# Patient Record
Sex: Female | Born: 1965
Health system: Southern US, Community
[De-identification: ages and names within clinical notes are randomized; demographics above are authoritative.]

## PROBLEM LIST (undated history)

## (undated) DIAGNOSIS — J45909 Unspecified asthma, uncomplicated: Secondary | ICD-10-CM

## (undated) DIAGNOSIS — F419 Anxiety disorder, unspecified: Secondary | ICD-10-CM

## (undated) DIAGNOSIS — I517 Cardiomegaly: Secondary | ICD-10-CM

## (undated) DIAGNOSIS — H409 Unspecified glaucoma: Secondary | ICD-10-CM

## (undated) DIAGNOSIS — G4730 Sleep apnea, unspecified: Secondary | ICD-10-CM

## (undated) DIAGNOSIS — R51 Headache: Secondary | ICD-10-CM

## (undated) DIAGNOSIS — I10 Essential (primary) hypertension: Secondary | ICD-10-CM

## (undated) DIAGNOSIS — I421 Obstructive hypertrophic cardiomyopathy: Secondary | ICD-10-CM

## (undated) DIAGNOSIS — R0602 Shortness of breath: Secondary | ICD-10-CM

## (undated) DIAGNOSIS — D219 Benign neoplasm of connective and other soft tissue, unspecified: Secondary | ICD-10-CM

## (undated) HISTORY — DX: Shortness of breath: R06.02

## (undated) HISTORY — DX: Benign neoplasm of connective and other soft tissue, unspecified: D21.9

## (undated) HISTORY — DX: Cardiomegaly: I51.7

## (undated) HISTORY — DX: Obstructive hypertrophic cardiomyopathy: I42.1

---

## 2004-09-12 HISTORY — PX: BREAST SURGERY: SHX581

## 2004-09-12 HISTORY — PX: ABDOMINOPLASTY: SUR9

## 2006-03-13 ENCOUNTER — Other Ambulatory Visit: Admission: RE | Admit: 2006-03-13 | Discharge: 2006-03-13 | Payer: Self-pay | Admitting: Gynecology

## 2007-03-28 ENCOUNTER — Other Ambulatory Visit: Admission: RE | Admit: 2007-03-28 | Discharge: 2007-03-28 | Payer: Self-pay | Admitting: Gynecology

## 2008-03-28 ENCOUNTER — Other Ambulatory Visit: Admission: RE | Admit: 2008-03-28 | Discharge: 2008-03-28 | Payer: Self-pay | Admitting: Obstetrics and Gynecology

## 2009-03-09 ENCOUNTER — Emergency Department (HOSPITAL_COMMUNITY): Admission: EM | Admit: 2009-03-09 | Discharge: 2009-03-09 | Payer: Self-pay | Admitting: Family Medicine

## 2009-03-30 ENCOUNTER — Other Ambulatory Visit: Admission: RE | Admit: 2009-03-30 | Discharge: 2009-03-30 | Payer: Self-pay | Admitting: Obstetrics and Gynecology

## 2009-03-30 ENCOUNTER — Encounter: Payer: Self-pay | Admitting: Women's Health

## 2009-03-30 ENCOUNTER — Ambulatory Visit: Payer: Self-pay | Admitting: Women's Health

## 2009-04-01 ENCOUNTER — Ambulatory Visit: Payer: Self-pay | Admitting: Women's Health

## 2010-03-17 ENCOUNTER — Ambulatory Visit: Payer: Self-pay | Admitting: Women's Health

## 2010-04-05 ENCOUNTER — Ambulatory Visit: Payer: Self-pay | Admitting: Obstetrics and Gynecology

## 2010-04-19 ENCOUNTER — Ambulatory Visit: Payer: Self-pay | Admitting: Obstetrics and Gynecology

## 2010-11-08 ENCOUNTER — Other Ambulatory Visit: Payer: BC Managed Care – PPO

## 2010-11-08 ENCOUNTER — Ambulatory Visit (INDEPENDENT_AMBULATORY_CARE_PROVIDER_SITE_OTHER): Payer: BC Managed Care – PPO | Admitting: Women's Health

## 2010-11-08 DIAGNOSIS — N912 Amenorrhea, unspecified: Secondary | ICD-10-CM

## 2010-11-09 ENCOUNTER — Ambulatory Visit (INDEPENDENT_AMBULATORY_CARE_PROVIDER_SITE_OTHER): Payer: BC Managed Care – PPO | Admitting: Gynecology

## 2010-11-09 DIAGNOSIS — O039 Complete or unspecified spontaneous abortion without complication: Secondary | ICD-10-CM

## 2010-11-10 ENCOUNTER — Other Ambulatory Visit: Payer: BC Managed Care – PPO

## 2010-12-01 ENCOUNTER — Ambulatory Visit: Payer: BC Managed Care – PPO | Admitting: Women's Health

## 2010-12-01 ENCOUNTER — Other Ambulatory Visit: Payer: BC Managed Care – PPO

## 2010-12-10 ENCOUNTER — Other Ambulatory Visit (HOSPITAL_COMMUNITY)
Admission: RE | Admit: 2010-12-10 | Discharge: 2010-12-10 | Disposition: A | Discharge status: Home / Self Care | Payer: BC Managed Care – PPO | Source: Ambulatory Visit | Attending: Obstetrics and Gynecology | Admitting: Obstetrics and Gynecology

## 2010-12-10 ENCOUNTER — Encounter (INDEPENDENT_AMBULATORY_CARE_PROVIDER_SITE_OTHER): Payer: BC Managed Care – PPO | Admitting: Women's Health

## 2010-12-10 ENCOUNTER — Other Ambulatory Visit: Payer: Self-pay | Admitting: Obstetrics and Gynecology

## 2010-12-10 DIAGNOSIS — Z01419 Encounter for gynecological examination (general) (routine) without abnormal findings: Secondary | ICD-10-CM

## 2010-12-10 DIAGNOSIS — Z124 Encounter for screening for malignant neoplasm of cervix: Secondary | ICD-10-CM | POA: Insufficient documentation

## 2010-12-10 DIAGNOSIS — Z3049 Encounter for surveillance of other contraceptives: Secondary | ICD-10-CM

## 2010-12-10 DIAGNOSIS — Z13220 Encounter for screening for lipoid disorders: Secondary | ICD-10-CM

## 2010-12-13 ENCOUNTER — Encounter: Payer: BC Managed Care – PPO | Admitting: Obstetrics and Gynecology

## 2010-12-30 ENCOUNTER — Ambulatory Visit (INDEPENDENT_AMBULATORY_CARE_PROVIDER_SITE_OTHER): Payer: BC Managed Care – PPO | Admitting: Obstetrics and Gynecology

## 2010-12-30 DIAGNOSIS — Z30431 Encounter for routine checking of intrauterine contraceptive device: Secondary | ICD-10-CM

## 2011-02-10 ENCOUNTER — Ambulatory Visit (INDEPENDENT_AMBULATORY_CARE_PROVIDER_SITE_OTHER): Payer: BC Managed Care – PPO | Admitting: Obstetrics and Gynecology

## 2011-02-10 DIAGNOSIS — N949 Unspecified condition associated with female genital organs and menstrual cycle: Secondary | ICD-10-CM

## 2011-02-10 DIAGNOSIS — N938 Other specified abnormal uterine and vaginal bleeding: Secondary | ICD-10-CM

## 2011-10-17 ENCOUNTER — Other Ambulatory Visit: Payer: Self-pay | Admitting: *Deleted

## 2011-10-17 MED ORDER — ALPRAZOLAM 0.25 MG PO TABS: 0.2500 mg | ORAL_TABLET | Freq: Every evening | ORAL | Status: DC | PRN

## 2011-10-17 NOTE — Telephone Encounter (Signed)
rx called in

## 2012-05-25 ENCOUNTER — Encounter: Payer: Self-pay | Admitting: Women's Health

## 2012-05-25 ENCOUNTER — Ambulatory Visit (INDEPENDENT_AMBULATORY_CARE_PROVIDER_SITE_OTHER): Payer: BC Managed Care – PPO | Admitting: Women's Health

## 2012-05-25 VITALS — BP 136/86 | Ht 65.0 in | Wt 202.0 lb

## 2012-05-25 DIAGNOSIS — Z01419 Encounter for gynecological examination (general) (routine) without abnormal findings: Secondary | ICD-10-CM

## 2012-05-25 DIAGNOSIS — N926 Irregular menstruation, unspecified: Secondary | ICD-10-CM

## 2012-05-25 DIAGNOSIS — N925 Other specified irregular menstruation: Secondary | ICD-10-CM

## 2012-05-25 DIAGNOSIS — N949 Unspecified condition associated with female genital organs and menstrual cycle: Secondary | ICD-10-CM

## 2012-05-25 DIAGNOSIS — N938 Other specified abnormal uterine and vaginal bleeding: Secondary | ICD-10-CM

## 2012-05-25 DIAGNOSIS — D259 Leiomyoma of uterus, unspecified: Secondary | ICD-10-CM

## 2012-05-25 LAB — TSH: TSH: 0.615 u[IU]/mL (ref 0.350–4.500)

## 2012-05-25 NOTE — Progress Notes (Signed)
Monica Marks [birth year] [id number]    History:    The patient presents for annual exam.  Mirena IUD placed 12/2010. First year light irregular cycle. Since June 2013 irregular bleeding with occasional heavy flow most days. History of normal Paps. Has not had a mammogram. History of fibroids   Past medical history, past surgical history, family history and social history were all reviewed and documented in the EPIC chart. Father with history of melanoma, both parents with hypertension, mother with lung cancer. Has had a breast lift with tummy tuck 2005. 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  3 children Katie 16, [name] 14, Bo 11, all doing well.   ROS:  A  ROS was performed and pertinent positives and negatives are included in the history.  Exam:  Filed Vitals:   05/25/12 1129  BP: 136/86    General appearance:  Normal Head/Neck:  Normal, without cervical or supraclavicular adenopathy. Thyroid:  Symmetrical, normal in size, without palpable masses or nodularity. Respiratory  Effort:  Normal  Auscultation:  Clear without wheezing or rhonchi Cardiovascular  Auscultation:  Regular rate, without rubs, murmurs or gallops  Edema/varicosities:  Not grossly evident Abdominal  Soft,nontender, without masses, guarding or rebound/tummy tuck scar.  Liver/spleen:  No organomegaly noted  Hernia:  None appreciated  Skin  Inspection:  Grossly normal  Palpation:  Grossly normal Neurologic/psychiatric  Orientation:  Normal with appropriate conversation.  Mood/affect:  Normal  Genitourinary    Breasts: Examined lying and sitting/ reduction scarring.     Right: Without masses, retractions, discharge or axillary adenopathy.     Left: Without masses, retractions, discharge or axillary adenopathy.   Inguinal/mons:  Normal without inguinal adenopathy  External genitalia:  Normal  BUS/Urethra/Skene's glands:  Normal  Bladder:  Normal  Vagina:  Normal  Cervix:  Normal IUD  string not visible  Uterus:   normal in size, shape and contour.  Midline and mobile  Adnexa/parametria:     Rt: Without masses or tenderness.   Lt: Without masses or tenderness.  Anus and perineum: Normal  Digital rectal exam: Normal sphincter tone without palpated masses or tenderness  Assessment/Plan:  46 y.o. M. WF G5 P3 for annual exam irregular bleeding with menorrhagia for 3 months.  Mirena IUD 12/2010 New onset menorrhagia and DUB for 3 months Fibroids  Plan: Labs at primary care, TSH, UA, UPT. No Pap history of normal Paps, new screening guidelines reviewed. Schedule sonohysterogram with Dr. Eda Paschal to check IUD placement and DUB. SBE's, reviewed importance of annual mammogram and instructed to schedule. Increase regular exercise and decrease calories for weight loss, calcium rich diet, vitamin D 1000 daily encouraged. Reviewed blood pressure slightly elevated today encouraged to monitor followup with primary care as needed. Continue annual skin checks.    Harrington Challenger [facility], 12:37 PM 05/25/2012

## 2012-05-25 NOTE — Patient Instructions (Addendum)

## 2012-05-26 LAB — URINALYSIS W MICROSCOPIC + REFLEX CULTURE
Bilirubin Urine: NEGATIVE
Glucose, UA: NEGATIVE mg/dL
Hgb urine dipstick: NEGATIVE
Protein, ur: NEGATIVE mg/dL
Squamous Epithelial / LPF: NONE SEEN

## 2012-06-06 ENCOUNTER — Other Ambulatory Visit: Payer: Self-pay | Admitting: Obstetrics and Gynecology

## 2012-06-06 ENCOUNTER — Ambulatory Visit (INDEPENDENT_AMBULATORY_CARE_PROVIDER_SITE_OTHER): Payer: BC Managed Care – PPO | Admitting: Obstetrics and Gynecology

## 2012-06-06 ENCOUNTER — Ambulatory Visit (INDEPENDENT_AMBULATORY_CARE_PROVIDER_SITE_OTHER): Payer: BC Managed Care – PPO

## 2012-06-06 DIAGNOSIS — N926 Irregular menstruation, unspecified: Secondary | ICD-10-CM

## 2012-06-06 DIAGNOSIS — D259 Leiomyoma of uterus, unspecified: Secondary | ICD-10-CM

## 2012-06-06 DIAGNOSIS — D219 Benign neoplasm of connective and other soft tissue, unspecified: Secondary | ICD-10-CM

## 2012-06-06 DIAGNOSIS — N949 Unspecified condition associated with female genital organs and menstrual cycle: Secondary | ICD-10-CM

## 2012-06-06 DIAGNOSIS — N938 Other specified abnormal uterine and vaginal bleeding: Secondary | ICD-10-CM

## 2012-06-06 NOTE — Patient Instructions (Addendum)
Schedule ultrasound and office visit at the end of October.

## 2012-06-06 NOTE — Progress Notes (Signed)
Patient came to see me today for a saline infusion histogram because of persistent menometrorrhagia with heavy bleeding many days since June, 2013. She had a Mirena IUD placed in 2012 and did well with that until this June. She has had 3 children by cesarean section and uses the IUD both for contraception and cycle control. We went ahead and did an ultrasound and the patient has multiple fibroids. There are at least 6 in the range in size with the largest being 3.3 cm. Her endometrial echo is 5 mm and there is a clear  submucous myoma seen on ultrasound of 3.5 cm. Her right ovary has an echo-free thin-walled cyst of 3.6 cm. It is avascular. Her left ovary is normal. Her cul-de-sac is free of fluid. A catheter was placed in her uterus and saline was introduced. The submucous myoma is approximately 70% in the cavity and 30% in the myometrial wall. An endometrial biopsy was done.  I discussed the options with the patient. It is possible with hysteroscopy we might be able to  remove the entire submucous myoma but we might not be able to. She obviously has the other fibroids as well. This patient strongly favors vaginal hysterectomy. She was given the ACOG  booklet on hysterectomy.she would like to do this surgery in November. We will do an ultrasound first with hopes that the cyst has resolved. The patient does not want to get pregnant again.

## 2012-07-09 ENCOUNTER — Other Ambulatory Visit: Payer: Self-pay | Admitting: Obstetrics and Gynecology

## 2012-07-09 ENCOUNTER — Ambulatory Visit (INDEPENDENT_AMBULATORY_CARE_PROVIDER_SITE_OTHER): Payer: BC Managed Care – PPO | Admitting: Obstetrics and Gynecology

## 2012-07-09 ENCOUNTER — Ambulatory Visit (INDEPENDENT_AMBULATORY_CARE_PROVIDER_SITE_OTHER): Payer: BC Managed Care – PPO

## 2012-07-09 DIAGNOSIS — D259 Leiomyoma of uterus, unspecified: Secondary | ICD-10-CM

## 2012-07-09 DIAGNOSIS — N838 Other noninflammatory disorders of ovary, fallopian tube and broad ligament: Secondary | ICD-10-CM

## 2012-07-09 DIAGNOSIS — N831 Corpus luteum cyst of ovary, unspecified side: Secondary | ICD-10-CM

## 2012-07-09 DIAGNOSIS — N83209 Unspecified ovarian cyst, unspecified side: Secondary | ICD-10-CM

## 2012-07-09 DIAGNOSIS — D219 Benign neoplasm of connective and other soft tissue, unspecified: Secondary | ICD-10-CM

## 2012-07-09 DIAGNOSIS — N839 Noninflammatory disorder of ovary, fallopian tube and broad ligament, unspecified: Secondary | ICD-10-CM

## 2012-07-09 DIAGNOSIS — N938 Other specified abnormal uterine and vaginal bleeding: Secondary | ICD-10-CM

## 2012-07-09 DIAGNOSIS — N949 Unspecified condition associated with female genital organs and menstrual cycle: Secondary | ICD-10-CM

## 2012-07-09 NOTE — Progress Notes (Signed)
The patient came back to discuss her upcoming surgery. Before we talked we did a followup ultrasound because of an ovarian cyst on her right ovary present in September. On ultrasound today multiple fibroids and once again seen. Her IUD was in place. The previous cyst on her right ovary has not changed in size or appearance. It remains thin-walled, avascular and echo-free. It is 3.3 cm. She now however has a second cyst of 2-1/2 cm with vascular flow to the mass. Her left ovary remains normal. Her cul-de-sac is free of fluid.  We discussed her impending surgery for menorrhagia with large submucous myoma. Our original plan was to do it vaginally. She appreciates she will not be Able to  get pregnant. We will leave her IUD in until the day of surgery. Due to the persistent right ovarian cyst plus a second ovarian cyst we will now do a right salpingo-oophorectomy. We decided to do this rather than ovarian cystectomies due to her age and to prevent adhesive disease. We will therefore start the surgery laparoscopically and do a right salpingo-oophorectomy, LAVH. We discussed normal recuperation and convalescence. She will stop nonsteroidal anti-inflammatory drugs. She had an abdominoplasty in [place]. We will get the operative note and the plastic surgeons recommendation regarding absorbable versus nonAbsorbable suture. The patient is aware of the risk of laparotomy. We would use her old cesarean section scars. She is aware of the risk of bladder injury due to 3 cesarean sections. We discussed other risks including infection, hemorrhage, need for blood, breakdown of incision, injury to bowel bladder or ureter and fistula formation. All her questions were answered in  a 30 minute interview.

## 2012-07-09 NOTE — Patient Instructions (Signed)
Get Me  old operative note.

## 2012-07-17 ENCOUNTER — Encounter (HOSPITAL_COMMUNITY): Payer: Self-pay | Admitting: Pharmacist

## 2012-07-23 ENCOUNTER — Other Ambulatory Visit: Payer: Self-pay

## 2012-07-23 ENCOUNTER — Encounter (HOSPITAL_COMMUNITY)
Admission: RE | Admit: 2012-07-23 | Discharge: 2012-07-23 | Disposition: A | Discharge status: Home / Self Care | Payer: BC Managed Care – PPO | Source: Ambulatory Visit | Attending: Obstetrics and Gynecology | Admitting: Obstetrics and Gynecology

## 2012-07-23 ENCOUNTER — Encounter (HOSPITAL_COMMUNITY): Payer: Self-pay

## 2012-07-23 HISTORY — DX: Essential (primary) hypertension: I10

## 2012-07-23 HISTORY — DX: Unspecified glaucoma: H40.9

## 2012-07-23 HISTORY — DX: Unspecified asthma, uncomplicated: J45.909

## 2012-07-23 HISTORY — DX: Headache: R51

## 2012-07-23 HISTORY — DX: Anxiety disorder, unspecified: F41.9

## 2012-07-23 LAB — CBC
HCT: 42.9 % (ref 36.0–46.0)
MCHC: 35.7 g/dL (ref 30.0–36.0)
Platelets: 226 10*3/uL (ref 150–400)
RDW: 12.2 % (ref 11.5–15.5)
WBC: 8.7 10*3/uL (ref 4.0–10.5)

## 2012-07-23 LAB — BASIC METABOLIC PANEL
Chloride: 99 mEq/L (ref 96–112)
GFR calc Af Amer: >90 mL/min (ref >90)
GFR calc non Af Amer: >90 mL/min (ref >90)
Potassium: 4.1 mEq/L (ref 3.5–5.1)
Sodium: 137 mEq/L (ref 135–145)

## 2012-07-23 LAB — SURGICAL PCR SCREEN
MRSA, PCR: NEGATIVE
Staphylococcus aureus: NEGATIVE

## 2012-07-23 NOTE — Patient Instructions (Addendum)
   Your procedure is scheduled on: Monday November 18th  Enter through the Main Entrance of [facility] at:6am Pick up the phone at the desk and dial [phone] and inform us of your arrival.  Please call this number if you have any problems the morning of surgery: [phone]  Remember: Do not eat or drink anything after midnight on Sunday Please take your blood pressure medicine, lexapro with sips of water morning of surgery. Bring inhalers with you  Do not wear jewelry, make-up, or FINGER nail polish No metal in your hair or on your body. Do not wear lotions, powders, perfumes. You may wear deodorant.  Please use your CHG wash as directed prior to surgery.  Do not shave anywhere for at least 12 hours prior to first CHG shower.  Do not bring valuables to the hospital. Contacts  may not be worn into surgery.  Leave suitcase in the car. After Surgery it may be brought to your room. For patients being admitted to the hospital, checkout time is 11:00am the day of discharge.

## 2012-07-23 NOTE — Pre-Procedure Instructions (Addendum)
Reviewed EKG/Meds/Health History with Dr Neta Ehlers for Surgery. Pt mother deceased from Lung Cancer, Father living/siblings living. Pt able to climb flight of stairs without sob. Does have astha-will bring inhalers dos.

## 2012-07-26 NOTE — H&P (Signed)
  Chief complaint: Fibroids with metromenorrhagia, right ovarian cysts. History of present illness: Patient is a 46 year old gravida 5 para 3 AB 2 who was doing well with a Mirena IUD with amenorrhea until June, 2013. She then started to have severe metromenorrhagia. She was scheduled for a saline infusion histogram which showed multiple fibroids including a large submucous fibroid that both filled the cavity and extended into the myometrium. After discussing options with the patient she has elected to have a hysterectomy. When the saline infusion histogram was done she had an echofree  right ovarian cyst. On followup ultrasound the cyst persisted and now had increased vascular flow to it in addition she had a complex second ovarian cyst. As a result of this she will also undergo right salpingo-oophorectomy. She will therefore have a laparoscopic vaginal hysterectomy, right salpingo-oophorectomy. She understands all the risks of surgery. She understands other options other than hysterectomy.  Past medical history, family history, social history, and review of systems in epic record and reviewed.  Physical examination: HEENT within normal limits. Neck: Thyroid not large. No masses. Supraclavicular nodes: not enlarged. Breasts: Examined in both sitting and lying  position. No skin changes and no masses. Abdomen: Soft no guarding rebound or masses or hernia. Pelvic: External: Within normal limits. BUS: Within normal limits. Vaginal:within normal limits. Good estrogen effect. No evidence of cystocele rectocele or enterocele. Cervix: clean. Uterus: 10 week fibroid uterus. Adnexa: No masses. Rectovaginal exam: Confirmatory and negative. Extremities: Within normal limits.  Assessment: #1. Fibroids with a large submucous myoma. #2. Persistent metromenorrhagia. #3. Persistent ovarian cysts.  Plan: Laparoscopic-assisted vaginal hysterectomy, right salpingo-oophorectomy.

## 2012-07-30 ENCOUNTER — Encounter (HOSPITAL_COMMUNITY): Payer: Self-pay | Admitting: Anesthesiology

## 2012-07-30 ENCOUNTER — Inpatient Hospital Stay (HOSPITAL_COMMUNITY)
Admission: RE | Admit: 2012-07-30 | Discharge: 2012-08-01 | DRG: 359 | Disposition: A | Discharge status: Home / Self Care | Payer: BC Managed Care – PPO | Source: Ambulatory Visit | Attending: Obstetrics and Gynecology | Admitting: Obstetrics and Gynecology

## 2012-07-30 ENCOUNTER — Encounter (HOSPITAL_COMMUNITY): Payer: Self-pay | Admitting: *Deleted

## 2012-07-30 ENCOUNTER — Encounter (HOSPITAL_COMMUNITY)
Admission: RE | Disposition: A | Discharge status: Home / Self Care | Payer: Self-pay | Source: Ambulatory Visit | Attending: Obstetrics and Gynecology

## 2012-07-30 ENCOUNTER — Ambulatory Visit (HOSPITAL_COMMUNITY): Payer: BC Managed Care – PPO | Admitting: Anesthesiology

## 2012-07-30 DIAGNOSIS — D259 Leiomyoma of uterus, unspecified: Secondary | ICD-10-CM

## 2012-07-30 DIAGNOSIS — N920 Excessive and frequent menstruation with regular cycle: Secondary | ICD-10-CM

## 2012-07-30 DIAGNOSIS — D250 Submucous leiomyoma of uterus: Secondary | ICD-10-CM | POA: Diagnosis present

## 2012-07-30 DIAGNOSIS — N83209 Unspecified ovarian cyst, unspecified side: Secondary | ICD-10-CM

## 2012-07-30 DIAGNOSIS — D251 Intramural leiomyoma of uterus: Principal | ICD-10-CM | POA: Diagnosis present

## 2012-07-30 DIAGNOSIS — Z5331 Laparoscopic surgical procedure converted to open procedure: Secondary | ICD-10-CM

## 2012-07-30 HISTORY — PX: LAPAROSCOPY: SHX197

## 2012-07-30 HISTORY — PX: SALPINGOOPHORECTOMY: SHX82

## 2012-07-30 HISTORY — PX: ABDOMINAL HYSTERECTOMY: SHX81

## 2012-07-30 LAB — HCG, SERUM, QUALITATIVE: Preg, Serum: NEGATIVE

## 2012-07-30 SURGERY — SALPINGO-OOPHORECTOMY, OPEN
Anesthesia: General | Site: Abdomen | Laterality: Right | Wound class: Clean Contaminated

## 2012-07-30 MED ORDER — PHENYLEPHRINE 40 MCG/ML (10ML) SYRINGE FOR IV PUSH (FOR BLOOD PRESSURE SUPPORT)
PREFILLED_SYRINGE | INTRAVENOUS | Status: AC
  Filled 2012-07-30: qty 5

## 2012-07-30 MED ORDER — GLYCOPYRROLATE 0.2 MG/ML IJ SOLN
INTRAMUSCULAR | Status: AC
  Filled 2012-07-30: qty 4

## 2012-07-30 MED ORDER — CEFAZOLIN SODIUM-DEXTROSE 2-3 GM-% IV SOLR
2.0000 g | INTRAVENOUS | Status: AC
  Administered 2012-07-30: 2 g via INTRAVENOUS

## 2012-07-30 MED ORDER — HYDROMORPHONE HCL PF 1 MG/ML IJ SOLN
INTRAMUSCULAR | Status: AC
  Administered 2012-07-30: 0.5 mg via INTRAVENOUS
  Filled 2012-07-30: qty 1

## 2012-07-30 MED ORDER — FENTANYL CITRATE 0.05 MG/ML IJ SOLN
INTRAMUSCULAR | Status: AC
  Filled 2012-07-30: qty 10

## 2012-07-30 MED ORDER — DEXAMETHASONE SODIUM PHOSPHATE 10 MG/ML IJ SOLN
INTRAMUSCULAR | Status: AC
  Filled 2012-07-30: qty 1

## 2012-07-30 MED ORDER — LACTATED RINGERS IR SOLN
Status: DC | PRN
  Administered 2012-07-30: 3000 mL

## 2012-07-30 MED ORDER — HYDROCODONE-ACETAMINOPHEN 5-325 MG PO TABS
1.0000 | ORAL_TABLET | ORAL | Status: DC | PRN
  Administered 2012-07-30 – 2012-08-01 (×8): 2 via ORAL
  Filled 2012-07-30 (×8): qty 2

## 2012-07-30 MED ORDER — KETOROLAC TROMETHAMINE 30 MG/ML IJ SOLN
INTRAMUSCULAR | Status: AC
  Filled 2012-07-30: qty 1

## 2012-07-30 MED ORDER — MEPERIDINE HCL 25 MG/ML IJ SOLN: 6.2500 mg | INTRAMUSCULAR | Status: DC | PRN

## 2012-07-30 MED ORDER — FENTANYL CITRATE 0.05 MG/ML IJ SOLN
INTRAMUSCULAR | Status: DC | PRN
  Administered 2012-07-30 (×2): 50 ug via INTRAVENOUS
  Administered 2012-07-30: 100 ug via INTRAVENOUS
  Administered 2012-07-30 (×2): 50 ug via INTRAVENOUS

## 2012-07-30 MED ORDER — BUPIVACAINE LIPOSOME 1.3 % IJ SUSP
20.0000 mL | Freq: Once | INTRAMUSCULAR | Status: AC
  Administered 2012-07-30: 20 mL
  Filled 2012-07-30: qty 20

## 2012-07-30 MED ORDER — PROPOFOL 10 MG/ML IV EMUL
INTRAVENOUS | Status: DC | PRN
  Administered 2012-07-30: 200 mg via INTRAVENOUS

## 2012-07-30 MED ORDER — METOCLOPRAMIDE HCL 5 MG/ML IJ SOLN: 10.0000 mg | Freq: Once | INTRAMUSCULAR | Status: DC | PRN

## 2012-07-30 MED ORDER — DIPHENHYDRAMINE HCL 50 MG/ML IJ SOLN
50.0000 mg | Freq: Once | INTRAMUSCULAR | Status: AC
Start: 2012-07-30 — End: 2012-07-30
  Administered 2012-07-30: 50 mg via INTRAVENOUS
  Filled 2012-07-30: qty 1

## 2012-07-30 MED ORDER — CEFAZOLIN SODIUM-DEXTROSE 2-3 GM-% IV SOLR
INTRAVENOUS | Status: AC
  Filled 2012-07-30: qty 50

## 2012-07-30 MED ORDER — ONDANSETRON HCL 4 MG/2ML IJ SOLN
INTRAMUSCULAR | Status: AC
  Filled 2012-07-30: qty 2

## 2012-07-30 MED ORDER — NEOSTIGMINE METHYLSULFATE 1 MG/ML IJ SOLN
INTRAMUSCULAR | Status: AC
  Filled 2012-07-30: qty 10

## 2012-07-30 MED ORDER — NEOSTIGMINE METHYLSULFATE 1 MG/ML IJ SOLN
INTRAMUSCULAR | Status: DC | PRN
  Administered 2012-07-30: 3 mg via INTRAVENOUS

## 2012-07-30 MED ORDER — ROCURONIUM BROMIDE 50 MG/5ML IV SOLN
INTRAVENOUS | Status: AC
  Filled 2012-07-30: qty 1

## 2012-07-30 MED ORDER — MIDAZOLAM HCL 2 MG/2ML IJ SOLN
INTRAMUSCULAR | Status: AC
  Filled 2012-07-30: qty 2

## 2012-07-30 MED ORDER — ONDANSETRON HCL 4 MG/2ML IJ SOLN
INTRAMUSCULAR | Status: DC | PRN
  Administered 2012-07-30: 4 mg via INTRAVENOUS

## 2012-07-30 MED ORDER — KETOROLAC TROMETHAMINE 30 MG/ML IJ SOLN: 30.0000 mg | Freq: Once | INTRAMUSCULAR | Status: DC

## 2012-07-30 MED ORDER — MENTHOL 3 MG MT LOZG
1.0000 | LOZENGE | OROMUCOSAL | Status: DC | PRN
  Filled 2012-07-30: qty 9

## 2012-07-30 MED ORDER — LACTATED RINGERS IV SOLN
INTRAVENOUS | Status: DC
  Administered 2012-07-30 (×3): via INTRAVENOUS

## 2012-07-30 MED ORDER — ONDANSETRON HCL 4 MG PO TABS: 4.0000 mg | ORAL_TABLET | Freq: Four times a day (QID) | ORAL | Status: DC | PRN

## 2012-07-30 MED ORDER — LIDOCAINE HCL (CARDIAC) 20 MG/ML IV SOLN
INTRAVENOUS | Status: AC
  Filled 2012-07-30: qty 5

## 2012-07-30 MED ORDER — HYDROMORPHONE HCL PF 1 MG/ML IJ SOLN
0.2500 mg | INTRAMUSCULAR | Status: DC | PRN
  Administered 2012-07-30 (×3): 0.5 mg via INTRAVENOUS

## 2012-07-30 MED ORDER — LIDOCAINE HCL (CARDIAC) 20 MG/ML IV SOLN
INTRAVENOUS | Status: DC | PRN
  Administered 2012-07-30: 50 mg via INTRAVENOUS

## 2012-07-30 MED ORDER — MIDAZOLAM HCL 5 MG/5ML IJ SOLN
INTRAMUSCULAR | Status: DC | PRN
  Administered 2012-07-30: 2 mg via INTRAVENOUS

## 2012-07-30 MED ORDER — ROCURONIUM BROMIDE 100 MG/10ML IV SOLN
INTRAVENOUS | Status: DC | PRN
  Administered 2012-07-30 (×2): 10 mg via INTRAVENOUS
  Administered 2012-07-30: 20 mg via INTRAVENOUS
  Administered 2012-07-30: 40 mg via INTRAVENOUS

## 2012-07-30 MED ORDER — DEXAMETHASONE SODIUM PHOSPHATE 4 MG/ML IJ SOLN
INTRAMUSCULAR | Status: DC | PRN
  Administered 2012-07-30: 8 mg via INTRAVENOUS

## 2012-07-30 MED ORDER — PHENYLEPHRINE HCL 10 MG/ML IJ SOLN
INTRAMUSCULAR | Status: DC | PRN
  Administered 2012-07-30 (×9): 40 ug via INTRAVENOUS

## 2012-07-30 MED ORDER — SCOPOLAMINE 1 MG/3DAYS TD PT72
MEDICATED_PATCH | TRANSDERMAL | Status: AC
  Administered 2012-07-30: 1.5 mg via TRANSDERMAL
  Filled 2012-07-30: qty 1

## 2012-07-30 MED ORDER — LIDOCAINE-EPINEPHRINE 0.5 %-1:200000 IJ SOLN
INTRAMUSCULAR | Status: DC | PRN
  Administered 2012-07-30: 30 mL

## 2012-07-30 MED ORDER — TEMAZEPAM 15 MG PO CAPS: 15.0000 mg | ORAL_CAPSULE | Freq: Every evening | ORAL | Status: DC | PRN

## 2012-07-30 MED ORDER — ONDANSETRON HCL 4 MG/2ML IJ SOLN: 4.0000 mg | Freq: Four times a day (QID) | INTRAMUSCULAR | Status: DC | PRN

## 2012-07-30 MED ORDER — PROPOFOL 10 MG/ML IV EMUL
INTRAVENOUS | Status: AC
  Filled 2012-07-30: qty 20

## 2012-07-30 MED ORDER — GLYCOPYRROLATE 0.2 MG/ML IJ SOLN
INTRAMUSCULAR | Status: DC | PRN
  Administered 2012-07-30: 0.2 mg via INTRAVENOUS

## 2012-07-30 MED ORDER — ACETAMINOPHEN 10 MG/ML IV SOLN
1000.0000 mg | Freq: Once | INTRAVENOUS | Status: AC
  Administered 2012-07-30: 1000 mg via INTRAVENOUS
  Filled 2012-07-30: qty 100

## 2012-07-30 MED ORDER — DIPHENHYDRAMINE HCL 50 MG/ML IJ SOLN
50.0000 mg | Freq: Four times a day (QID) | INTRAMUSCULAR | Status: DC | PRN
  Administered 2012-07-30: 50 mg via INTRAVENOUS
  Filled 2012-07-30: qty 1

## 2012-07-30 MED ORDER — KETOROLAC TROMETHAMINE 30 MG/ML IJ SOLN
INTRAMUSCULAR | Status: DC | PRN
  Administered 2012-07-30: 30 mg via INTRAVENOUS

## 2012-07-30 MED ORDER — OXYCODONE-ACETAMINOPHEN 5-325 MG PO TABS: 1.0000 | ORAL_TABLET | ORAL | Status: DC | PRN

## 2012-07-30 MED ORDER — LIDOCAINE-EPINEPHRINE 0.5 %-1:200000 IJ SOLN
INTRAMUSCULAR | Status: AC
  Filled 2012-07-30: qty 1

## 2012-07-30 MED ORDER — HYDROMORPHONE HCL PF 1 MG/ML IJ SOLN
INTRAMUSCULAR | Status: AC
  Filled 2012-07-30: qty 1

## 2012-07-30 MED ORDER — DEXTROSE-NACL 5-0.45 % IV SOLN
INTRAVENOUS | Status: DC
  Administered 2012-07-30 (×2): via INTRAVENOUS

## 2012-07-30 MED ORDER — GLYCOPYRROLATE 0.2 MG/ML IJ SOLN
INTRAMUSCULAR | Status: DC | PRN
  Administered 2012-07-30: 0.6 mg via INTRAVENOUS

## 2012-07-30 MED ORDER — SCOPOLAMINE 1 MG/3DAYS TD PT72
1.0000 | MEDICATED_PATCH | TRANSDERMAL | Status: DC
  Administered 2012-07-30: 1.5 mg via TRANSDERMAL

## 2012-07-30 SURGICAL SUPPLY — 55 items
ADH SKN CLS APL DERMABOND .7 (GAUZE/BANDAGES/DRESSINGS) ×3
CATH ROBINSON RED A/P 16FR (CATHETERS) ×2 IMPLANT
CLOTH BEACON ORANGE TIMEOUT ST (SAFETY) ×4 IMPLANT
CONT PATH 16OZ SNAP LID 3702 (MISCELLANEOUS) ×6 IMPLANT
COVER TABLE BACK 60X90 (DRAPES) ×4 IMPLANT
DECANTER SPIKE VIAL GLASS SM (MISCELLANEOUS) ×2 IMPLANT
DERMABOND ADVANCED (GAUZE/BANDAGES/DRESSINGS) ×1
DERMABOND ADVANCED .7 DNX12 (GAUZE/BANDAGES/DRESSINGS) ×3 IMPLANT
DRESSING TELFA 8X3 (GAUZE/BANDAGES/DRESSINGS) ×2 IMPLANT
DRSG COVADERM PLUS 2X2 (GAUZE/BANDAGES/DRESSINGS) ×2 IMPLANT
ELECT REM PT RETURN 9FT ADLT (ELECTROSURGICAL) ×4
ELECTRODE REM PT RTRN 9FT ADLT (ELECTROSURGICAL) ×3 IMPLANT
EVACUATOR SMOKE 8.L (FILTER) ×4 IMPLANT
GLOVE BIOGEL PI IND STRL 6.5 (GLOVE) ×3 IMPLANT
GLOVE BIOGEL PI IND STRL 7.5 (GLOVE) ×6 IMPLANT
GLOVE BIOGEL PI INDICATOR 6.5 (GLOVE) ×1
GLOVE BIOGEL PI INDICATOR 7.5 (GLOVE) ×2
GLOVE ECLIPSE 7.0 STRL STRAW (GLOVE) ×8 IMPLANT
GOWN PREVENTION PLUS LG XLONG (DISPOSABLE) ×16 IMPLANT
LIGASURE 5MM LAPAROSCOPIC (INSTRUMENTS) IMPLANT
LIGASURE LAP ATLAS 10MM 37CM (INSTRUMENTS) IMPLANT
NS IRRIG 1000ML POUR BTL (IV SOLUTION) ×4 IMPLANT
PACK LAVH (CUSTOM PROCEDURE TRAY) ×4 IMPLANT
PAD ABD 7.5X8 STRL (GAUZE/BANDAGES/DRESSINGS) ×2 IMPLANT
PENCIL BUTTON HOLSTER BLD 10FT (ELECTRODE) ×4 IMPLANT
PROTECTOR NERVE ULNAR (MISCELLANEOUS) ×4 IMPLANT
SEALER TISSUE G2 CVD JAW 35 (ENDOMECHANICALS) IMPLANT
SEALER TISSUE G2 CVD JAW 45CM (ENDOMECHANICALS)
SET IRRIG TUBING LAPAROSCOPIC (IRRIGATION / IRRIGATOR) ×4 IMPLANT
SOLUTION ELECTROLUBE (MISCELLANEOUS) ×4 IMPLANT
SPONGE GAUZE 4X4 12PLY (GAUZE/BANDAGES/DRESSINGS) ×2 IMPLANT
STRIP CLOSURE SKIN 1/4X3 (GAUZE/BANDAGES/DRESSINGS) IMPLANT
SUT CHROMIC 1 TIES 18 (SUTURE) ×4 IMPLANT
SUT CHROMIC 1MO 4 18 CR8 (SUTURE) ×12 IMPLANT
SUT MON AB 3-0 SH 27 (SUTURE) ×4
SUT MON AB 3-0 SH27 (SUTURE) ×1 IMPLANT
SUT PDS AB 0 CT 36 (SUTURE) ×4 IMPLANT
SUT PLAIN 3 0 TIES 3 18 (SUTURE) IMPLANT
SUT VIC AB 0 CT1 27 (SUTURE) ×16
SUT VIC AB 0 CT1 27XBRD ANBCTR (SUTURE) ×12 IMPLANT
SUT VIC AB 0 CT1 36 (SUTURE) ×4 IMPLANT
SUT VIC AB 3-0 X1 27 (SUTURE) IMPLANT
SYR 20CC LL (SYRINGE) ×4 IMPLANT
SYR BULB IRRIGATION 50ML (SYRINGE) ×4 IMPLANT
TAPE CLOTH SURG 4X10 WHT LF (GAUZE/BANDAGES/DRESSINGS) ×2 IMPLANT
TOWEL OR 17X24 6PK STRL BLUE (TOWEL DISPOSABLE) ×8 IMPLANT
TRAY FOLEY CATH 14FR (SET/KITS/TRAYS/PACK) ×4 IMPLANT
TROCAR 12M 150ML BLUNT (TROCAR) ×2 IMPLANT
TROCAR BALLN 12MMX100 BLUNT (TROCAR) ×2 IMPLANT
TROCAR XCEL NON-BLD 11X100MML (ENDOMECHANICALS) ×4 IMPLANT
TROCAR XCEL NON-BLD 5MMX100MML (ENDOMECHANICALS) ×8 IMPLANT
TUBING CONNECTOR 18X5MM (MISCELLANEOUS) ×2 IMPLANT
WARMER LAPAROSCOPE (MISCELLANEOUS) ×4 IMPLANT
WATER STERILE IRR 1000ML POUR (IV SOLUTION) ×2 IMPLANT
YANKAUER SUCT BULB TIP NO VENT (SUCTIONS) ×2 IMPLANT

## 2012-07-30 NOTE — Op Note (Signed)
Preoperative diagnosis: Menometrorrhagia with multiple fibroids including submucous myoma not confined to the endometrial cavity. Multiple ovarian cysts of the right ovary. Postoperative diagnosis: Same plus dense fascial tissue from previous abdominoplasty and periumbilical adhesions. Operation: Attempt at both closed and open laparoscopy followed by total abdominal hysterectomy and right salpingo-oophorectomy. Surgeon: Dr. Eda Paschal First Asst.: Dr. Lily Peer Anesthesia: Gen.  Findings: There was extreme scarring of the rectus fascia. This was extreme scarring of the posterior sheath of the rectus. The patient also had periumbilical adhesions. The uterus was enlarged by multiple fibroids. The right ovary was enlarged by several ovarian cysts which appear benign. The left ovary and fallopian tube were normal. The pelvic peritoneum was free of disease.  Procedure: The patient was taken to the operating room and placed in the dorsolithotomy position. She was prepped and draped in usual sterile manner. A Foley catheter was placed in her bladder. A Hulka catheter was placed the uterus. A subumbilical incision was made. Using the Optiview and a diagnostic laparoscope and a camera for magnification and attempt was made to enter the peritoneal cavity. The tissue was extremely dense and thick. We could not elevate the anterior abdominal wall due to the tightness of the abdomen from her previous abdominoplasty. Initially we could not reach the peritoneal cavity with a short Optiview. We changed to the long Optiview And initially thought we were in but encountered adhesive disease. We pushed just as hard as we felt was safe. Both Dr. Brayton Layman and I tried unsuccessfully. We then converted to an open laparoscopy. The rectus fascia was divided and tagged with 0 Vicryl. The peritoneal cavity was opened by sharp dissection. But when we placed the [facility] catheter and looked with the laparoscope we once again encountered  adhesive disease and could not see the pelvis. We felt we have tried every maneuver possible that was safe. We therefore decided to convert to a laparotomy. A low transverse incision was made at the site of her abdominoplasty incision. The rectus fascia was opened transversely. It was extremely dense, solid and difficult to transverse  with a scalpel. Several staples were encountered which were removed. Scarpa's fascia was well developed as well and was extremely scarred. The peritoneal cavity was entered by sharp dissection. Entry was atraumatic as had been the attempts with the laparoscope. The periumbilical fascial incision was closed with 0 Vicryl. Clearly it was well brought together. Prior to suturing the subumbilical fascia the adhesions which had been encountered at the umbilicus from her previous surgeries were sharply lysed. This left no adhesive disease to the anterior abdominal wall. Peritoneal washings were then obtained. The round ligaments were clamped cut and suture ligated with 0 chromic. The left adnexa was normal. The utero-ovarian ligament, round ligament on the left was clamped cut and suture ligated with #1 chromic catgut leaving the left ovary and tube in the patient. The right adnexa with the ovarian cysts was elevated. The ureter was identified. The infundibulopelvic ligament was isolated and clamped cut and suture ligated with #1 chromic catgut. The vesicouterine fold of peritoneum and posterior peritoneum was sharply dissected. The bladder was sharply dissected free from the uterus to a level below the cervical vaginal junction. Uterine arteries were bilaterally clamped cut and suture ligated with #1 chromic catgut. The parametrium was taken down and successive bites by clamping cutting and suture ligating with #1 chromic catgut. The cervical vaginal junction was identified. It was entered with sharp dissection. And the cervix was separated from the vagina with  sharp dissection. Peritoneal  washings, uterus, right ovary and fallopian tube were sent to pathology for tissue diagnosis. The vaginal cuff was closed with figure of eights of 0 Vicryl. Copious irrigation was done with sterile saline. There was no bleeding. 2 sponge needle and instrument counts were correct. Exparel was used at the vaginal cuff. The rectus muscles were brought together in the midline with 2-0 plain suture. Injection of all layers was then done with exparel. A total of 30 cc was used. The rectus fascia was then brought together with 2 running 0 PDS sutures. Scarpa's fascia was brought together with 0 Vicryl. Both skin incisions were closed with staples. Estimated blood loss was 75 cc with none replaced. The patient left the operating room in satisfactory condition draining clear urine from her Foley catheter.

## 2012-07-30 NOTE — Interval H&P Note (Signed)
History and Physical Interval Note:  07/30/2012 6:33 AM  Monica Marks  has presented today for surgery, with the diagnosis of fibroids, metromenorrhagia  The various methods of treatment have been discussed with the patient and family. After consideration of risks, benefits and other options for treatment, the patient has consented to  Procedure(s) (LRB) with comments: LAPAROSCOPIC ASSISTED VAGINAL HYSTERECTOMY (N/A) - Dr. Lily Peer to assist.   SALPINGO OOPHORECTOMY (Right) as a surgical intervention .  The patient's history has been reviewed, patient examined, no change in status, stable for surgery.  I have reviewed the patient's chart and labs.  Questions were answered to the patient's satisfaction.     [name]

## 2012-07-30 NOTE — Anesthesia Preprocedure Evaluation (Signed)
Anesthesia Evaluation  Patient identified by MRN, date of birth, ID band Patient awake    Reviewed: Allergy & Precautions, H&P , NPO status , Patient's Chart, lab work & pertinent test results  Airway Mallampati: II TM Distance: >3 FB Neck ROM: full    Dental No notable dental hx. (+) Teeth Intact   Pulmonary asthma , former smoker,  breath sounds clear to auscultation  Pulmonary exam normal       Cardiovascular hypertension, On Medications Rhythm:regular Rate:Normal     Neuro/Psych  Headaches, negative psych ROS   GI/Hepatic negative GI ROS, Neg liver ROS,   Endo/Other  negative endocrine ROS  Renal/GU negative Renal ROS  negative genitourinary   Musculoskeletal   Abdominal   Peds  Hematology negative hematology ROS (+)   Anesthesia Other Findings   Reproductive/Obstetrics Fibroids DUB                           Anesthesia Physical Anesthesia Plan  ASA: II  Anesthesia Plan: General ETT   Post-op Pain Management:    Induction:   Airway Management Planned:   Additional Equipment:   Intra-op Plan:   Post-operative Plan:   Informed Consent: I have reviewed the patients History and Physical, chart, labs and discussed the procedure including the risks, benefits and alternatives for the proposed anesthesia with the patient or authorized representative who has indicated his/her understanding and acceptance.   Dental Advisory Given  Plan Discussed with: Anesthesiologist, CRNA and Surgeon  Anesthesia Plan Comments:         Anesthesia Quick Evaluation

## 2012-07-30 NOTE — Addendum Note (Signed)
Addendum  created 07/30/12 1745 by Elbert Ewings, CRNA   Modules edited:Notes Section

## 2012-07-30 NOTE — Anesthesia Postprocedure Evaluation (Signed)
  Anesthesia Post-op Note  Patient: Monica Marks  Procedure(s) Performed: Procedure(s) (LRB) with comments: SALPINGO OOPHORECTOMY (Right) LAPAROSCOPY DIAGNOSTIC (N/A) - attempted HYSTERECTOMY ABDOMINAL (N/A)  Patient Location: PACU and Women's Unit  Anesthesia Type:General  Level of Consciousness: awake, alert  and oriented  Airway and Oxygen Therapy: Patient Spontanous Breathing  Post-op Pain: mild  Post-op Assessment: Patient's Cardiovascular Status Stable, Respiratory Function Stable, No signs of Nausea or vomiting and Adequate PO intake  Post-op Vital Signs: stable  Complications: No apparent anesthesia complications

## 2012-07-30 NOTE — Transfer of Care (Signed)
Immediate Anesthesia Transfer of Care Note  Patient: Monica Marks  Procedure(s) Performed: Procedure(s) (LRB) with comments: SALPINGO OOPHORECTOMY (Right) LAPAROSCOPY DIAGNOSTIC (N/A) - attempted HYSTERECTOMY ABDOMINAL (N/A)  Patient Location: PACU  Anesthesia Type:General  Level of Consciousness: awake, alert , oriented and patient cooperative  Airway & Oxygen Therapy: Patient Spontanous Breathing and Patient connected to nasal cannula oxygen  Post-op Assessment: Report given to PACU RN and Post -op Vital signs reviewed and stable  Post vital signs: Reviewed and stable  Complications: No apparent anesthesia complications

## 2012-07-30 NOTE — Anesthesia Postprocedure Evaluation (Signed)
  Anesthesia Post-op Note  Patient: Monica Marks  Procedure(s) Performed: Procedure(s) (LRB) with comments: SALPINGO OOPHORECTOMY (Right) LAPAROSCOPY DIAGNOSTIC (N/A) - attempted HYSTERECTOMY ABDOMINAL (N/A)  Patient Location: PACU  Anesthesia Type:General  Level of Consciousness: awake, alert  and oriented  Airway and Oxygen Therapy: Patient Spontanous Breathing  Post-op Pain: mild  Post-op Assessment: Post-op Vital signs reviewed, Patient's Cardiovascular Status Stable, Respiratory Function Stable, Patent Airway, No signs of Nausea or vomiting and Pain level controlled  Post-op Vital Signs: Reviewed and stable  Complications: No apparent anesthesia complications

## 2012-07-30 NOTE — Addendum Note (Signed)
Addendum  created 07/30/12 1745 by Mordechai Matuszak S Jaedah Lords, CRNA   Modules edited:Notes Section    

## 2012-07-31 ENCOUNTER — Encounter (HOSPITAL_COMMUNITY): Payer: Self-pay | Admitting: Obstetrics and Gynecology

## 2012-07-31 MED FILL — Heparin Sodium (Porcine) Inj 5000 Unit/ML: INTRAMUSCULAR | Qty: 1 | Status: AC

## 2012-07-31 NOTE — Progress Notes (Signed)
UR done. 

## 2012-07-31 NOTE — Progress Notes (Signed)
Patient ID: Monica Marks, female   DOB: March 12, 1966, 46 y.o.   MRN: [id number] Post operative day 1. Afebrile. Pt doing well. No nause. Passing gas. No bleeding. Output excellent. Urine clear.  Abdomen soft with good bowel sounds. We will discontinue foley and watch.

## 2012-08-01 MED ORDER — HYDROCODONE-ACETAMINOPHEN 5-325 MG PO TABS: 1.0000 | ORAL_TABLET | ORAL | Status: DC | PRN

## 2012-08-01 NOTE — Plan of Care (Signed)
Problem: Phase II Progression Outcomes Goal: Incision/dressings dry and intact Outcome: Progressing Umb. Port c slight serous drainage, LT inc intact dry.

## 2012-08-01 NOTE — Progress Notes (Signed)
Patient ID: Monica Marks, female   DOB: Oct 12, 1965, 46 y.o.   MRN: [id number] Post operative day 2.  Pt afebrile. Passing gas. No complaints. Voiding well. Abdomen is soft. Incisions are fine. Will discharge.

## 2012-08-01 NOTE — Discharge Summary (Signed)
  Date of admission: 07/30/2012 Date of discharge: 08/01/2012  Patient is a 46 year old female who presented with fibroids, submucous and intramural, severe menorrhagia, and persistent right ovarian cyst. She was taken to the operating room and attempt was made to do a laparoscopic-assisted vaginal hysterectomy. Due to her previous abdominoplasty, severe scarring, and pelvic adhesions this was not possible. She underwent a laparotomy with total abdominal hysterectomy and right salpingo-oophorectomy. Postoperatively she did well. By the second postoperative day she was tolerating a by mouth diet, passing gas, and voiding well. She was discharged on Vicodin for pain relief. She will return to office in 2 days for staple removal.  Condition on discharge: Improved Diet on discharge: Regular Activity on discharge: Ambulatory Laboratory data: In epic record and  Unremarkable. Pathology report: Not available at time of dictation.  Discharge diagnoses: Multiple fibroids including submucous fibroid with menometrorrhagia. Persistent right ovarian cyst. Periumbilical adhesions.  Operation: Attempted laparoscopy both open and closed. Followed by total abdominal hysterectomy, lysis of adhesions, and right salpingo-oophorectomy.

## 2012-08-03 ENCOUNTER — Ambulatory Visit (INDEPENDENT_AMBULATORY_CARE_PROVIDER_SITE_OTHER): Payer: BC Managed Care – PPO | Admitting: Obstetrics and Gynecology

## 2012-08-03 DIAGNOSIS — D259 Leiomyoma of uterus, unspecified: Secondary | ICD-10-CM

## 2012-08-03 DIAGNOSIS — D219 Benign neoplasm of connective and other soft tissue, unspecified: Secondary | ICD-10-CM

## 2012-08-03 NOTE — Patient Instructions (Signed)
Return to office in 3 weeks.

## 2012-08-03 NOTE — Progress Notes (Signed)
Patient came back today for staple removal. She is having no problems.  Exam: Abdomen is soft with incisions looking good and without infection.  Assessment: Normal postoperative course  Plan: Staples removed. Incisions Steri-Stripped. Discussed path which was fibroids, adenomyosis, adenomatoid tumor, and benign cyst of right adnexa. Return to office in 3 weeks.

## 2012-08-13 ENCOUNTER — Ambulatory Visit (INDEPENDENT_AMBULATORY_CARE_PROVIDER_SITE_OTHER): Payer: BC Managed Care – PPO | Admitting: Obstetrics and Gynecology

## 2012-08-13 DIAGNOSIS — T148XXA Other injury of unspecified body region, initial encounter: Secondary | ICD-10-CM

## 2012-08-13 DIAGNOSIS — R350 Frequency of micturition: Secondary | ICD-10-CM

## 2012-08-13 LAB — URINALYSIS W MICROSCOPIC + REFLEX CULTURE
Hgb urine dipstick: NEGATIVE
Leukocytes, UA: NEGATIVE
Nitrite: NEGATIVE
Protein, ur: NEGATIVE mg/dL
Urobilinogen, UA: 0.2 mg/dL (ref 0.0–1.0)

## 2012-08-13 MED ORDER — SULFAMETHOXAZOLE-TRIMETHOPRIM 800-160 MG PO TABS: 1.0000 | ORAL_TABLET | Freq: Two times a day (BID) | ORAL | Status: DC

## 2012-08-13 NOTE — Patient Instructions (Signed)
Use peroxide on incision 3-4 times daily. Warm soaks to incision. Return if necessary. Otherwise return in 2 weeks. Start antibiotics.

## 2012-08-13 NOTE — Progress Notes (Signed)
Patient came in today because of dysuria and drainage from her umbilical incision. The drainages foul-smelling. She is afebrile. Her urinalysis that was normal.  Exam: Kennon Portela present. Abdomen shows that both incisions are intact. There is however purulent drainage from the periumbilical incision. Her Pfannenstiel incision is slightly red but there is no drainage.  Assessment: Superficial infection at periumbilical area. Possible urinary tract infection.  Plan: H2O2 4 times daily. Septra DS twice a day for 7 days. Warm soaks to incision. Urine incisional cultures done. Return in 2 weeks but sooner when necessary.

## 2012-08-16 LAB — WOUND CULTURE

## 2012-08-23 ENCOUNTER — Ambulatory Visit (INDEPENDENT_AMBULATORY_CARE_PROVIDER_SITE_OTHER): Payer: BC Managed Care – PPO | Admitting: Obstetrics and Gynecology

## 2012-08-23 DIAGNOSIS — N809 Endometriosis, unspecified: Secondary | ICD-10-CM | POA: Insufficient documentation

## 2012-08-23 NOTE — Progress Notes (Signed)
Patient came back today for a postoperative visit. Shortly after starting the Septra her periumbilical infection cleared up. Culture did show infection sensitive to sulfa. Her urine culture was negative. She is now completely asymptomatic.  Exam: Monica Marks present. Abdomen is soft without guarding. Incisions were healing well. Vaginal exam shows the cuff is healed. Bimanual exam is normal.  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  Plan: Patient reassured. She'll return next September for yearly visit. Discussed pathology report.

## 2012-08-23 NOTE — Patient Instructions (Signed)
Call with any problems

## 2013-05-27 ENCOUNTER — Encounter: Payer: BC Managed Care – PPO | Admitting: Women's Health

## 2013-05-28 ENCOUNTER — Encounter: Payer: Self-pay | Admitting: Women's Health

## 2013-05-28 ENCOUNTER — Ambulatory Visit (INDEPENDENT_AMBULATORY_CARE_PROVIDER_SITE_OTHER): Payer: BC Managed Care – PPO | Admitting: Women's Health

## 2013-05-28 VITALS — BP 134/88 | Ht 65.5 in | Wt 214.6 lb

## 2013-05-28 DIAGNOSIS — Z01419 Encounter for gynecological examination (general) (routine) without abnormal findings: Secondary | ICD-10-CM

## 2013-05-28 DIAGNOSIS — D259 Leiomyoma of uterus, unspecified: Secondary | ICD-10-CM

## 2013-05-28 DIAGNOSIS — N938 Other specified abnormal uterine and vaginal bleeding: Secondary | ICD-10-CM

## 2013-05-28 DIAGNOSIS — N949 Unspecified condition associated with female genital organs and menstrual cycle: Secondary | ICD-10-CM

## 2013-05-28 NOTE — Progress Notes (Signed)
Monica Marks 1965/12/04 [id number]    History:    The patient presents for annual exam.  TAH November 2013 for fibroids and dysfunctional uterine bleeding. States has felt much better since surgery. Normal Pap history last mammogram 2006. Hypertension and depression managed by primary care. Same partner.   Past medical history, past surgical history, family history and social history were all reviewed and documented in the EPIC chart. Owns a business refer pushing furniture. History of a tummy tuck with breast lift 2005. Monica Marks 19, Monica Marks 17, Monica Marks 12 all doing well. Father melanoma. Mother hypertension died of lung cancer.   ROS:  A  ROS was performed and pertinent positives and negatives are included in the history.  Exam:  Filed Vitals:   05/28/13 0840  BP: 134/88    General appearance:  Normal Head/Neck:  Normal, without cervical or supraclavicular adenopathy. Thyroid:  Symmetrical, normal in size, without palpable masses or nodularity. Respiratory  Effort:  Normal  Auscultation:  Clear without wheezing or rhonchi Cardiovascular  Auscultation:  Regular rate, without rubs, murmurs or gallops  Edema/varicosities:  Not grossly evident Abdominal  Soft,nontender, without masses, guarding or rebound.  Liver/spleen:  No organomegaly noted  Hernia:  None appreciated  Skin  Inspection:  Grossly normal  Palpation:  Grossly normal Neurologic/psychiatric  Orientation:  Normal with appropriate conversation.  Mood/affect:  Normal  Genitourinary    Breasts: Examined lying and sitting.     Right: Without masses, retractions, discharge or axillary adenopathy.     Left: Without masses, retractions, discharge or axillary adenopathy.   Inguinal/mons:  Normal without inguinal adenopathy  External genitalia:  Normal  BUS/Urethra/Skene's glands:  Normal  Bladder:  Normal  Vagina:  Normal  Cervix:  Absent  Uterus:  Absent Adnexa/parametria:     Rt: Without masses or  tenderness.   Lt: Without masses or tenderness.  Anus and perineum: Normal  Digital rectal exam: Normal sphincter tone without palpated masses or tenderness  Assessment/Plan:  47 y.o. D. WF G3 P3 for annual exam.     TAH for fibroids 2013 Hypertension and depression-primary care manages labs and meds Obesity  Plan: SBE's, reviewed importance of an annual screening mammogram and instructed to schedule. Increase regular exercise, decrease calories for weight loss. Vitamin D 2000 daily encouraged. New Pap screening guidelines reviewed.   Harrington Challenger [facility], 9:06 AM 05/28/2013

## 2013-05-28 NOTE — Patient Instructions (Addendum)
Schedule mammogram!!!!! Vit D 2000 Brisk walk daily Health Maintenance, Females A healthy lifestyle and preventative care can promote health and wellness.  Maintain regular health, dental, and eye exams.  Eat a healthy diet. Foods like vegetables, fruits, whole grains, low-fat dairy products, and lean protein foods contain the nutrients you need without too many calories. Decrease your intake of foods high in solid fats, added sugars, and salt. Get information about a proper diet from your caregiver, if necessary.  Regular physical exercise is one of the most important things you can do for your health. Most adults should get at least 150 minutes of moderate-intensity exercise (any activity that increases your heart rate and causes you to sweat) each week. In addition, most adults need muscle-strengthening exercises on 2 or more days a week.   Maintain a healthy weight. The body mass index (BMI) is a screening tool to identify possible weight problems. It provides an estimate of body fat based on height and weight. Your caregiver can help determine your BMI, and can help you achieve or maintain a healthy weight. For adults 20 years and older:  A BMI below 18.5 is considered underweight.  A BMI of 18.5 to 24.9 is normal.  A BMI of 25 to 29.9 is considered overweight.  A BMI of 30 and above is considered obese.  Maintain normal blood lipids and cholesterol by exercising and minimizing your intake of saturated fat. Eat a balanced diet with plenty of fruits and vegetables. Blood tests for lipids and cholesterol should begin at age 10 and be repeated every 5 years. If your lipid or cholesterol levels are high, you are over 50, or you are a high risk for heart disease, you may need your cholesterol levels checked more frequently.Ongoing high lipid and cholesterol levels should be treated with medicines if diet and exercise are not effective.  If you smoke, find out from your caregiver how to  quit. If you do not use tobacco, do not start.  If you are pregnant, do not drink alcohol. If you are breastfeeding, be very cautious about drinking alcohol. If you are not pregnant and choose to drink alcohol, do not exceed 1 drink per day. One drink is considered to be 12 ounces (355 mL) of beer, 5 ounces (148 mL) of wine, or 1.5 ounces (44 mL) of liquor.  Avoid use of street drugs. Do not share needles with anyone. Ask for help if you need support or instructions about stopping the use of drugs.  High blood pressure causes heart disease and increases the risk of stroke. Blood pressure should be checked at least every 1 to 2 years. Ongoing high blood pressure should be treated with medicines, if weight loss and exercise are not effective.  If you are 25 to 45 years old, ask your caregiver if you should take aspirin to prevent strokes.  Diabetes screening involves taking a blood sample to check your fasting blood sugar level. This should be done once every 3 years, after age 77, if you are within normal weight and without risk factors for diabetes. Testing should be considered at a younger age or be carried out more frequently if you are overweight and have at least 1 risk factor for diabetes.  Breast cancer screening is essential preventative care for women. You should practice "breast self-awareness." This means understanding the normal appearance and feel of your breasts and may include breast self-examination. Any changes detected, no matter how small, should be reported to a caregiver.  Women in their 27s and 30s should have a clinical breast exam (CBE) by a caregiver as part of a regular health exam every 1 to 3 years. After age 74, women should have a CBE every year. Starting at age 4, women should consider having a mammogram (breast X-ray) every year. Women who have a family history of breast cancer should talk to their caregiver about genetic screening. Women at a high risk of breast cancer  should talk to their caregiver about having an MRI and a mammogram every year.  The Pap test is a screening test for cervical cancer. Women should have a Pap test starting at age 59. Between ages 23 and 65, Pap tests should be repeated every 2 years. Beginning at age 35, you should have a Pap test every 3 years as long as the past 3 Pap tests have been normal. If you had a hysterectomy for a problem that was not cancer or a condition that could lead to cancer, then you no longer need Pap tests. If you are between ages 29 and 32, and you have had normal Pap tests going back 10 years, you no longer need Pap tests. If you have had past treatment for cervical cancer or a condition that could lead to cancer, you need Pap tests and screening for cancer for at least 20 years after your treatment. If Pap tests have been discontinued, risk factors (such as a new sexual partner) need to be reassessed to determine if screening should be resumed. Some women have medical problems that increase the chance of getting cervical cancer. In these cases, your caregiver may recommend more frequent screening and Pap tests.  The human papillomavirus (HPV) test is an additional test that may be used for cervical cancer screening. The HPV test looks for the virus that can cause the cell changes on the cervix. The cells collected during the Pap test can be tested for HPV. The HPV test could be used to screen women aged 96 years and older, and should be used in women of any age who have unclear Pap test results. After the age of 61, women should have HPV testing at the same frequency as a Pap test.  Colorectal cancer can be detected and often prevented. Most routine colorectal cancer screening begins at the age of 44 and continues through age 27. However, your caregiver may recommend screening at an earlier age if you have risk factors for colon cancer. On a yearly basis, your caregiver may provide home test kits to check for hidden  blood in the stool. Use of a small camera at the end of a tube, to directly examine the colon (sigmoidoscopy or colonoscopy), can detect the earliest forms of colorectal cancer. Talk to your caregiver about this at age 26, when routine screening begins. Direct examination of the colon should be repeated every 5 to 10 years through age 47, unless early forms of pre-cancerous polyps or small growths are found.  Hepatitis C blood testing is recommended for all people born from [birth year] through 1963 and any individual with known risks for hepatitis C.  Practice safe sex. Use condoms and avoid high-risk sexual practices to reduce the spread of sexually transmitted infections (STIs). Sexually active women aged 40 and younger should be checked for Chlamydia, which is a common sexually transmitted infection. Older women with new or multiple partners should also be tested for Chlamydia. Testing for other STIs is recommended if you are sexually active and at increased risk.  Osteoporosis is a disease in which the bones lose minerals and strength with aging. This can result in serious bone fractures. The risk of osteoporosis can be identified using a bone density scan. Women ages 39 and over and women at risk for fractures or osteoporosis should discuss screening with their caregivers. Ask your caregiver whether you should be taking a calcium supplement or vitamin D to reduce the rate of osteoporosis.  Menopause can be associated with physical symptoms and risks. Hormone replacement therapy is available to decrease symptoms and risks. You should talk to your caregiver about whether hormone replacement therapy is right for you.  Use sunscreen with a sun protection factor (SPF) of 30 or greater. Apply sunscreen liberally and repeatedly throughout the day. You should seek shade when your shadow is shorter than you. Protect yourself by wearing long sleeves, pants, a wide-brimmed hat, and sunglasses year round, whenever you  are outdoors.  Notify your caregiver of new moles or changes in moles, especially if there is a change in shape or color. Also notify your caregiver if a mole is larger than the size of a pencil eraser.  Stay current with your immunizations. Document Released: 03/14/2011 Document Revised: 11/21/2011 Document Reviewed: 03/14/2011 [facility] Patient Information 2014 [place], [place].

## 2013-08-20 ENCOUNTER — Ambulatory Visit (HOSPITAL_COMMUNITY): Payer: BC Managed Care – PPO | Attending: Family Medicine | Admitting: Cardiology

## 2013-08-20 ENCOUNTER — Encounter (INDEPENDENT_AMBULATORY_CARE_PROVIDER_SITE_OTHER): Payer: Self-pay

## 2013-08-20 ENCOUNTER — Other Ambulatory Visit (HOSPITAL_COMMUNITY): Payer: Self-pay | Admitting: Family Medicine

## 2013-08-20 DIAGNOSIS — I079 Rheumatic tricuspid valve disease, unspecified: Secondary | ICD-10-CM | POA: Insufficient documentation

## 2013-08-20 DIAGNOSIS — R011 Cardiac murmur, unspecified: Secondary | ICD-10-CM | POA: Insufficient documentation

## 2013-08-20 DIAGNOSIS — I059 Rheumatic mitral valve disease, unspecified: Secondary | ICD-10-CM | POA: Insufficient documentation

## 2013-08-20 DIAGNOSIS — I10 Essential (primary) hypertension: Secondary | ICD-10-CM | POA: Insufficient documentation

## 2013-08-20 NOTE — Progress Notes (Signed)
Echo performed. 

## 2013-08-21 ENCOUNTER — Telehealth: Payer: Self-pay | Admitting: Nurse Practitioner

## 2013-08-21 NOTE — Telephone Encounter (Signed)
Patient referred for echo by PCP, did not have f/u scheduled with a cardiologist.  I reported echo results by Dr. Shirlee Latch and patient is scheduled to see Dr. Delton See 12/16 @ 10:30

## 2013-08-21 NOTE — Telephone Encounter (Signed)
Message copied by Levi Aland on Wed Aug 21, 2013  1:56 PM ------      Message from: Laurey Morale      Created: Wed Aug 21, 2013 10:49 AM       Echo consistent with hypertrophic cardiomyopathy.  Does she have cardiology followup? I cannot see this from the chart. ------

## 2013-08-23 ENCOUNTER — Encounter: Payer: Self-pay | Admitting: *Deleted

## 2013-08-27 ENCOUNTER — Ambulatory Visit (INDEPENDENT_AMBULATORY_CARE_PROVIDER_SITE_OTHER): Payer: BC Managed Care – PPO | Admitting: Cardiology

## 2013-08-27 ENCOUNTER — Encounter: Payer: Self-pay | Admitting: Cardiology

## 2013-08-27 VITALS — BP 136/90 | HR 101 | Ht 65.5 in | Wt 216.0 lb

## 2013-08-27 DIAGNOSIS — I421 Obstructive hypertrophic cardiomyopathy: Secondary | ICD-10-CM

## 2013-08-27 DIAGNOSIS — R9431 Abnormal electrocardiogram [ECG] [EKG]: Secondary | ICD-10-CM

## 2013-08-27 DIAGNOSIS — I10 Essential (primary) hypertension: Secondary | ICD-10-CM

## 2013-08-27 MED ORDER — METOPROLOL TARTRATE 25 MG PO TABS: 25.0000 mg | ORAL_TABLET | Freq: Two times a day (BID) | ORAL | Status: DC

## 2013-08-27 NOTE — Patient Instructions (Signed)
Schedule Cardiac MRI with [facility] at [facility]   Stop HCTZ  Start Metoprolol 25 mg twice a day   Your physician recommends that you schedule a follow-up appointment in: 2 months

## 2013-08-27 NOTE — Progress Notes (Signed)
Patient ID: Thuy Atilano, female   DOB: 1966-01-10, 47 y.o.   MRN: [id number]     Patient Name: Monica Marks Date of Encounter: 08/27/2013  Primary Care Provider:  Farris Has, MD Primary Cardiologist:  Tobias Alexander, H   Problem List   Past Medical History  Diagnosis Date  . Fibroid   . Hypertension   . Asthma     bring inhaler dos  . Anxiety   . Headache(784.0)   . Glaucoma    Past Surgical History  Procedure Laterality Date  . Breast surgery  2006    BREAST LIFT  . Abdominoplasty  2006  . Cesarean section      3 TIMES  . Salpingoophorectomy  07/30/2012    Procedure: SALPINGO OOPHORECTOMY;  Surgeon: Trellis Paganini, MD;  Location: [facility];  Service: Gynecology;  Laterality: Right;  . Laparoscopy  07/30/2012    Procedure: LAPAROSCOPY DIAGNOSTIC;  Surgeon: Trellis Paganini, MD;  Location: [facility];  Service: Gynecology;  Laterality: N/A;  attempted  . Abdominal hysterectomy  07/30/2012    Procedure: HYSTERECTOMY ABDOMINAL;  Surgeon: Trellis Paganini, MD;  Location: [facility];  Service: Gynecology;  Laterality: N/A;    Allergies  No Known Allergies  HPI  A very pleasant 47 year old female with h/o hypertension, asthma and family h/o HCM in her father. Her father is post ICD implantation. Her siblings were tested for HCM and they are all negative. She was never tested. The patient is asymptomatic but had an echocardiogram ordered for a loud murmur. TTE showed severe LVH with SAM and with a resting LVOT gradient of which increased to with Valsalva. The patient is not aware of these results. She denies prior palpitations, syncope or chest pain. She gets SOB on moderate exercise.   Home Medications  Prior to Admission medications   Medication Sig Start Date End Date Taking? Authorizing Provider  ciclesonide (ALVESCO) 160 MCG/ACT inhaler Inhale 1 puff into the lungs daily.   Yes Historical Provider, MD  escitalopram (LEXAPRO) 20 MG  tablet Take 20 mg by mouth daily.   Yes Historical Provider, MD  fexofenadine (ALLEGRA) 180 MG tablet Take 180 mg by mouth daily.   Yes Historical Provider, MD  hydrochlorothiazide (HYDRODIURIL) 25 MG tablet Take 25 mg by mouth daily.   Yes Historical Provider, MD  latanoprost (XALATAN) 0.005 % ophthalmic solution Place 1 drop into both eyes at bedtime.   Yes Historical Provider, MD  Multiple Vitamins-Minerals (WOMENS MULTI VITAMIN & MINERAL PO) Take 1 tablet by mouth daily.   Yes Historical Provider, MD  triamcinolone (NASACORT) 55 MCG/ACT nasal inhaler Place 2 sprays into the nose daily.   Yes Historical Provider, MD    Family History  Family History  Problem Relation Age of Onset  . Hypertension Mother   . Cancer Mother   . Lung cancer Father   . Melanoma Father   . Heart disease Father     Social History  History   Social History  . Marital Status: Married    Spouse Name: N/A    Number of Children: N/A  . Years of Education: N/A   Occupational History  . Not on file.   Social History Main Topics  . Smoking status: Former Smoker -- 1.00 packs/day    Types: Cigarettes    Quit date: 05/25/2008  . Smokeless tobacco: Not on file  . Alcohol Use: Yes     Comment: occasional  . Drug Use: No  .  Sexual Activity: Yes    Birth Control/ Protection: Surgical   Other Topics Concern  . Not on file   Social History Narrative  . No narrative on file     Review of Systems, as per HPI, otherwise negative General:  No chills, fever, night sweats or weight changes.  Cardiovascular:  No chest pain, dyspnea on exertion, edema, orthopnea, palpitations, paroxysmal nocturnal dyspnea. Dermatological: No rash, lesions/masses Respiratory: No cough, dyspnea Urologic: No hematuria, dysuria Abdominal:   No nausea, vomiting, diarrhea, bright red blood per rectum, melena, or hematemesis Neurologic:  No visual changes, wkns, changes in mental status. All other systems reviewed and are  otherwise negative except as noted above.  Physical Exam  Blood pressure 136/90, pulse 101, height 5' 5.5" (1.664 m), weight 216 lb (97.977 kg), last menstrual period 07/23/2012.  General: Pleasant, NAD Psych: Normal affect. Neuro: Alert and oriented X 3. Moves all extremities spontaneously. HEENT: Normal  Neck: Supple without bruits or JVD. Lungs:  Resp regular and unlabored, CTA. Heart: RRR no s3, s4, loud holosystolic murmur. Abdomen: Soft, non-tender, non-distended, BS + x 4.  Extremities: No clubbing, cyanosis or edema. DP/PT/Radials 2+ and equal bilaterally.  Labs:  No results found for this basename: CKTOTAL, CKMB, TROPONINI,  in the last 72 hours Lab Results  Component Value Date   WBC 8.7 07/23/2012   HGB 15.3* 07/23/2012   HCT 42.9 07/23/2012   MCV 91.5 07/23/2012   PLT 226 07/23/2012   No results found for this basename: NA, K, CL, CO2, BUN, CREATININE, CALCIUM, LABALBU, PROT, BILITOT, ALKPHOS, ALT, AST, GLUCOSE,  in the last 168 hours No results found for this basename: CHOL, HDL, LDLCALC, TRIG   No results found for this basename: DDIMER   No components found with this basename: POCBNP,   Accessory Clinical Findings  Echocardiogram 08/20/13 Study Conclusions  - Left ventricle: There appears to be some Chordal SAM with turbulence in the LVOT with a resting LVOT gradient of which increased to with Valsalva The cavity size was normal. There was mild concentric and severe asymmetric hypertrophy, consistent with hypertrophic cardiomyopathy. Systolic function was normal. The estimated ejection fraction was in the range of 60% to 65%. Wall motion was normal; there were no regional wall motion abnormalities. There was an increased relative contribution of atrial contraction to ventricular filling. Doppler parameters are consistent with abnormal left ventricular relaxation (grade 1 diastolic dysfunction). - Left atrium: The atrium was mildly  dilated. - Atrial septum: A patent foramen ovale cannot be excluded. - Pericardium, extracardiac: There was a left pleural effusion.   ECG - Sinus tachycardia, right atrial enlargement, 1.5 mm downsloaping STD in inferolateral leads    Assessment & Plan  47 year old female with family h/o HCM and a echo findings consistent with HCM.  We will start her on Metoprolol to alleviate LVOT obstruction. She was advised to avoid strenuous exercise and dehydration and advised about dangerous warning signs.  We will discontinue HCHTZ to have room for BB titration, we will start with metoprolol 25 mg po bid and increase to 50 mg bid in a week if tolerated.  We will order a cardiac MRI to evaluate for extension LGE (myocardial disarray) for the risk stratification (LGE is proven to predict VTs a worse outcome). We will also perform a stress MRI for concern of abnormal ECG.  Risks and warning signs of HCM explained, she was advised to have her 3 children screened by an echocardiogram.  Follow up  in 2 months  Lars Masson, MD, [facility] 08/27/2013, 11:00 AM

## 2013-08-28 DIAGNOSIS — I10 Essential (primary) hypertension: Secondary | ICD-10-CM | POA: Insufficient documentation

## 2013-08-28 DIAGNOSIS — I421 Obstructive hypertrophic cardiomyopathy: Secondary | ICD-10-CM | POA: Insufficient documentation

## 2013-09-03 ENCOUNTER — Encounter: Payer: Self-pay | Admitting: Cardiology

## 2013-09-03 ENCOUNTER — Telehealth: Payer: Self-pay

## 2013-09-03 NOTE — Telephone Encounter (Signed)
Pt is scheduled to have a cardiac MRI on 12/29 and needs to have a BMET drawn before test. Pt verbalized understanding and states that she will come to office either tomorrow or Friday to have drawn. BMET ordered.

## 2013-09-04 ENCOUNTER — Ambulatory Visit: Payer: BC Managed Care – PPO | Admitting: *Deleted

## 2013-09-06 ENCOUNTER — Other Ambulatory Visit (INDEPENDENT_AMBULATORY_CARE_PROVIDER_SITE_OTHER): Payer: BC Managed Care – PPO

## 2013-09-06 DIAGNOSIS — I421 Obstructive hypertrophic cardiomyopathy: Secondary | ICD-10-CM

## 2013-09-06 DIAGNOSIS — N809 Endometriosis, unspecified: Secondary | ICD-10-CM

## 2013-09-06 DIAGNOSIS — I10 Essential (primary) hypertension: Secondary | ICD-10-CM

## 2013-09-06 LAB — BASIC METABOLIC PANEL
BUN: 13 mg/dL (ref 6–23)
CO2: 28 mEq/L (ref 19–32)
Calcium: 8.8 mg/dL (ref 8.4–10.5)
Chloride: 102 mEq/L (ref 96–112)
Creatinine, Ser: 0.7 mg/dL (ref 0.4–1.2)
GFR: 97.43 mL/min (ref >60.00)
Glucose, Bld: 82 mg/dL (ref 70–99)
Potassium: 4.1 mEq/L (ref 3.5–5.1)
Sodium: 137 mEq/L (ref 135–145)

## 2013-09-09 ENCOUNTER — Ambulatory Visit (HOSPITAL_COMMUNITY)
Admission: RE | Admit: 2013-09-09 | Discharge: 2013-09-09 | Disposition: A | Discharge status: Home / Self Care | Payer: BC Managed Care – PPO | Source: Ambulatory Visit | Attending: Cardiology | Admitting: Cardiology

## 2013-09-09 DIAGNOSIS — I517 Cardiomegaly: Secondary | ICD-10-CM | POA: Insufficient documentation

## 2013-09-09 DIAGNOSIS — R9431 Abnormal electrocardiogram [ECG] [EKG]: Secondary | ICD-10-CM

## 2013-09-09 DIAGNOSIS — I421 Obstructive hypertrophic cardiomyopathy: Secondary | ICD-10-CM

## 2013-09-09 MED ORDER — GADOBENATE DIMEGLUMINE 529 MG/ML IV SOLN
30.0000 mL | Freq: Once | INTRAVENOUS | Status: AC
  Administered 2013-09-09: 30 mL via INTRAVENOUS

## 2013-09-16 ENCOUNTER — Encounter: Payer: Self-pay | Admitting: Cardiology

## 2013-09-18 ENCOUNTER — Telehealth: Payer: Self-pay

## 2013-09-18 DIAGNOSIS — R9431 Abnormal electrocardiogram [ECG] [EKG]: Secondary | ICD-10-CM

## 2013-09-18 DIAGNOSIS — I421 Obstructive hypertrophic cardiomyopathy: Secondary | ICD-10-CM

## 2013-09-18 MED ORDER — METOPROLOL TARTRATE 50 MG PO TABS: 50.0000 mg | ORAL_TABLET | Freq: Two times a day (BID) | ORAL | Status: DC

## 2013-09-18 NOTE — Telephone Encounter (Signed)
Per DR Meda Coffee the pt is advised to increase her Metoprolol to 50 mg twice daily and that she is being referred to Dr Caryl Comes in EP for ICD implantation. She verbalized understanding and is aware that someone from this office will call her to schedule her appt with Dr Caryl Comes.

## 2013-10-09 ENCOUNTER — Institutional Professional Consult (permissible substitution): Payer: BC Managed Care – PPO | Admitting: Internal Medicine

## 2013-10-10 ENCOUNTER — Ambulatory Visit (INDEPENDENT_AMBULATORY_CARE_PROVIDER_SITE_OTHER): Payer: BC Managed Care – PPO | Admitting: Internal Medicine

## 2013-10-10 ENCOUNTER — Encounter: Payer: Self-pay | Admitting: Internal Medicine

## 2013-10-10 VITALS — BP 128/84 | HR 70 | Ht 65.5 in | Wt 218.0 lb

## 2013-10-10 DIAGNOSIS — R9431 Abnormal electrocardiogram [ECG] [EKG]: Secondary | ICD-10-CM

## 2013-10-10 DIAGNOSIS — I421 Obstructive hypertrophic cardiomyopathy: Secondary | ICD-10-CM

## 2013-10-10 MED ORDER — METOPROLOL TARTRATE 50 MG PO TABS: 75.0000 mg | ORAL_TABLET | Freq: Two times a day (BID) | ORAL | Status: DC

## 2013-10-10 NOTE — Patient Instructions (Signed)
Your physician recommends that you schedule a follow-up appointment in: 8 weeks with Dr Lovena Le  Your physician has requested that you have an exercise tolerance test. For further information please visit [email or web address]. Please also follow instruction sheet, as given.  Your physician has recommended that you wear a holter monitor. Holter monitors are medical devices that record the heart's electrical activity. Doctors most often use these monitors to diagnose arrhythmias. Arrhythmias are problems with the speed or rhythm of the heartbeat. The monitor is a small, portable device. You can wear one while you do your normal daily activities. This is usually used to diagnose what is causing palpitations/syncope (passing out).  Your physician has recommended you make the following change in your medication:  1) Increase Metoprolol to 1 tablet (50mg ) in the am and 1 1/2 tablets (75mg ) in the pm for 2 weeks then increase to 1 1/2 tablets(75mg ) twice daily

## 2013-10-10 NOTE — Progress Notes (Signed)
HPI Monica Marks is referred today by Dr. Meda Coffee for evaluation of HCM. The patient is a very pleasant 48 year old woman with a history of shortness of breath with exertion and a heart murmur. A 2-D echo demonstrated a left ventricular free wall dimension of approximately 19 mm and systolic anterior motion of the mitral valve. Her resting gradient was approximately 40 mm of mercury. It increased with Valsalva. I fall the patient's father, who also has hypertrophic cardiomyopathy and ventricular tachycardia. He is status post ICD implantation. The patient has never had syncope. She has not had any documented ventricular arrhythmias. The patient does have a questionable history of alcohol abuse though she denies. She has been placed on metoprolol, and her dose of hydrochlorothiazide discontinued and her symptoms are improved. She has class II heart failure symptoms. She denies palpitations. No Known Allergies   Current Outpatient Prescriptions  Medication Sig Dispense Refill  . escitalopram (LEXAPRO) 20 MG tablet Take 20 mg by mouth daily.      Marland Kitchen latanoprost (XALATAN) 0.005 % ophthalmic solution Place 1 drop into both eyes at bedtime.      . metoprolol tartrate (LOPRESSOR) 50 MG tablet Take 1 tablet (50 mg total) by mouth 2 (two) times daily.  60 tablet  6  . montelukast (SINGULAIR) 10 MG tablet Take 1 tablet by mouth at bedtime.      . Multiple Vitamins-Minerals (WOMENS MULTI VITAMIN & MINERAL PO) Take 1 tablet by mouth daily.      . progesterone (PROMETRIUM) 100 MG capsule Take 1 capsule by mouth at bedtime.       No current facility-administered medications for this visit.     Past Medical History  Diagnosis Date  . Fibroid   . Hypertension   . Asthma     bring inhaler dos  . Anxiety   . Headache(784.0)   . Glaucoma   . LVH (left ventricular hypertrophy)     TTE showed severe LVH with SAM & a resting LVOT gradient of 59mmHg which increased to 76mmHg with Valsalva   . SOB  (shortness of breath) on exertion     During moderate exercise  . HOCM (hypertrophic obstructive cardiomyopathy)     ROS:   All systems reviewed and negative except as noted in the HPI.   Past Surgical History  Procedure Laterality Date  . Breast surgery  2006    BREAST LIFT  . Abdominoplasty  2006  . Cesarean section      3 TIMES  . Salpingoophorectomy  07/30/2012    Procedure: SALPINGO OOPHORECTOMY;  Surgeon: Bennetta Laos, MD;  Location: [facility];  Service: Gynecology;  Laterality: Right;  . Laparoscopy  07/30/2012    Procedure: LAPAROSCOPY DIAGNOSTIC;  Surgeon: Bennetta Laos, MD;  Location: [facility];  Service: Gynecology;  Laterality: N/A;  attempted  . Abdominal hysterectomy  07/30/2012    Procedure: HYSTERECTOMY ABDOMINAL;  Surgeon: Bennetta Laos, MD;  Location: [facility];  Service: Gynecology;  Laterality: N/A;     Family History  Problem Relation Age of Onset  . Hypertension Mother   . Cancer Mother   . Lung cancer Father   . Melanoma Father   . Heart disease Father     HCM, s/p ICD implant     History   Social History  . Marital Status: Married    Spouse Name: N/A    Number of Children: N/A  . Years of Education: N/A   Occupational History  .  Not on file.   Social History Main Topics  . Smoking status: Former Smoker -- 1.00 packs/day    Types: Cigarettes    Quit date: 05/25/2008  . Smokeless tobacco: Not on file  . Alcohol Use: Yes     Comment: occasional  . Drug Use: No  . Sexual Activity: Yes    Birth Control/ Protection: Surgical   Other Topics Concern  . Not on file   Social History Narrative  . No narrative on file     BP 128/84  Pulse 70  Ht 5' 5.5" (1.664 m)  Wt 218 lb (98.884 kg)  BMI 35.71 kg/m2  LMP 07/23/2012  Physical Exam:  Well appearing middle-age woman,NAD HEENT: Unremarkable Neck:  7 cm JVD, no thyromegally Back:  No CVA tenderness Lungs:  Clear with no wheezes, rales, or rhonchi. HEART:  Regular rate  rhythm, grade 2/6 systolic murmur heard best at the right upper sternal border as well as at the base, no rubs, no clicks Abd:  soft, positive bowel sounds, no organomegally, no rebound, no guarding Ext:  2 plus pulses, no edema, no cyanosis, no clubbing Skin:  No rashes no nodules Neuro:  CN II through XII intact, motor grossly intact  EKG - normal sinus rhythm with inferolateral ST-T wave abnormality.   Assess/Plan:

## 2013-10-10 NOTE — Assessment & Plan Note (Signed)
The patient's hypertrophic cardiomyopathy is of intermediate risk. Her father developed ventricular tachycardia in his late 60s. I've recommended that the patient undergo exercise treadmill testing to see if she has a hypotensive response to exercise. I've asked the patient to wear a 24-hour Holter monitor. In addition, her blood pressure and heart rate will allow [place] to up titrate her beta blocker from 50 mg twice a day to 75 mg twice a day. Additional treatment recommendations will be based on the results of her stress test and her Holter monitor. Finally, I've asked the patient to drink no more than one alcoholic beverage a day.

## 2013-10-11 ENCOUNTER — Ambulatory Visit (HOSPITAL_COMMUNITY)
Admission: RE | Admit: 2013-10-11 | Discharge: 2013-10-11 | Disposition: A | Discharge status: Home / Self Care | Payer: BC Managed Care – PPO | Source: Ambulatory Visit | Attending: Cardiovascular Disease | Admitting: Cardiovascular Disease

## 2013-10-11 DIAGNOSIS — R9431 Abnormal electrocardiogram [ECG] [EKG]: Secondary | ICD-10-CM

## 2013-10-11 DIAGNOSIS — I421 Obstructive hypertrophic cardiomyopathy: Secondary | ICD-10-CM

## 2013-10-15 ENCOUNTER — Encounter (INDEPENDENT_AMBULATORY_CARE_PROVIDER_SITE_OTHER): Payer: BC Managed Care – PPO

## 2013-10-15 ENCOUNTER — Encounter: Payer: Self-pay | Admitting: *Deleted

## 2013-10-15 DIAGNOSIS — I421 Obstructive hypertrophic cardiomyopathy: Secondary | ICD-10-CM

## 2013-10-15 DIAGNOSIS — R002 Palpitations: Secondary | ICD-10-CM

## 2013-10-15 DIAGNOSIS — R9431 Abnormal electrocardiogram [ECG] [EKG]: Secondary | ICD-10-CM

## 2013-10-15 NOTE — Progress Notes (Signed)
Patient ID: Monica Marks, female   DOB: May 30, 1966, 49 y.o.   MRN: [id number] E-[facility] 24 hour holter monitor applied to patient.

## 2013-10-29 ENCOUNTER — Ambulatory Visit: Payer: BC Managed Care – PPO | Admitting: Cardiology

## 2013-12-05 ENCOUNTER — Ambulatory Visit (INDEPENDENT_AMBULATORY_CARE_PROVIDER_SITE_OTHER): Payer: BC Managed Care – PPO | Admitting: Internal Medicine

## 2013-12-05 ENCOUNTER — Encounter: Payer: Self-pay | Admitting: Internal Medicine

## 2013-12-05 VITALS — BP 143/84 | HR 79 | Ht 65.5 in | Wt 222.0 lb

## 2013-12-05 DIAGNOSIS — I10 Essential (primary) hypertension: Secondary | ICD-10-CM

## 2013-12-05 DIAGNOSIS — I421 Obstructive hypertrophic cardiomyopathy: Secondary | ICD-10-CM

## 2013-12-05 NOTE — Assessment & Plan Note (Signed)
Her symptoms are fairly well controlled. I have asked the patient to start walking daily at a rate of 3 miles an hour. No more strenuous activity than this. She is going to continue her beta blocker which she has tolerated well. She is not at increased risk for sudden death at this point. Her septum is 19 mm. She did not have hypotension with exertion. I have asked her to reduce her sodium intake and not drink ETOH and limit caffiene to one drink a day.

## 2013-12-05 NOTE — Progress Notes (Signed)
HPI Monica Marks returns today for followup. She is a pleasant 48 yo woman with obesity, HTN, and HOCM. She has not had syncope. Subsequent evaluation has demonstrated no sustained or non-sustained VT by holter monitoring, no hypotensive response to exercise on exercise treadmill testing, and good tolerance to taking beta blocker therapy. She has dyspnea and chest tightness with exertion. She admits to sodium indiscretion. No Known Allergies   Current Outpatient Prescriptions  Medication Sig Dispense Refill  . escitalopram (LEXAPRO) 20 MG tablet Take 20 mg by mouth daily.      Marland Kitchen latanoprost (XALATAN) 0.005 % ophthalmic solution Place 1 drop into both eyes at bedtime.      . metoprolol (LOPRESSOR) 50 MG tablet Take 1.5 tablets (75 mg total) by mouth 2 (two) times daily.  90 tablet  6  . montelukast (SINGULAIR) 10 MG tablet Take 1 tablet by mouth at bedtime.      . Multiple Vitamins-Minerals (WOMENS MULTI VITAMIN & MINERAL PO) Take 1 tablet by mouth daily.      . progesterone (PROMETRIUM) 100 MG capsule Take 1 capsule by mouth at bedtime.       No current facility-administered medications for this visit.     Past Medical History  Diagnosis Date  . Fibroid   . Hypertension   . Asthma     bring inhaler dos  . Anxiety   . Headache(784.0)   . Glaucoma   . LVH (left ventricular hypertrophy)     TTE showed severe LVH with SAM & a resting LVOT gradient of 74mmHg which increased to 64mmHg with Valsalva   . SOB (shortness of breath) on exertion     During moderate exercise  . HOCM (hypertrophic obstructive cardiomyopathy)     ROS:   All systems reviewed and negative except as noted in the HPI.   Past Surgical History  Procedure Laterality Date  . Breast surgery  2006    BREAST LIFT  . Abdominoplasty  2006  . Cesarean section      3 TIMES  . Salpingoophorectomy  07/30/2012    Procedure: SALPINGO OOPHORECTOMY;  Surgeon: Bennetta Laos, MD;  Location: [facility];  Service:  Gynecology;  Laterality: Right;  . Laparoscopy  07/30/2012    Procedure: LAPAROSCOPY DIAGNOSTIC;  Surgeon: Bennetta Laos, MD;  Location: [facility];  Service: Gynecology;  Laterality: N/A;  attempted  . Abdominal hysterectomy  07/30/2012    Procedure: HYSTERECTOMY ABDOMINAL;  Surgeon: Bennetta Laos, MD;  Location: [facility];  Service: Gynecology;  Laterality: N/A;     Family History  Problem Relation Age of Onset  . Hypertension Mother   . Cancer Mother   . Lung cancer Father   . Melanoma Father   . Heart disease Father     HCM, s/p ICD implant     History   Social History  . Marital Status: Married    Spouse Name: N/A    Number of Children: N/A  . Years of Education: N/A   Occupational History  . Not on file.   Social History Main Topics  . Smoking status: Former Smoker -- 1.00 packs/day    Types: Cigarettes    Quit date: 05/25/2008  . Smokeless tobacco: Not on file  . Alcohol Use: Yes     Comment: occasional  . Drug Use: No  . Sexual Activity: Yes    Birth Control/ Protection: Surgical   Other Topics Concern  . Not on file  Social History Narrative  . No narrative on file     BP 143/84  Pulse 79  Ht 5' 5.5" (1.664 m)  Wt 222 lb (100.699 kg)  BMI 36.37 kg/m2  LMP 07/23/2012  Physical Exam:  Well appearing middle aged woman, NAD HEENT: Unremarkable Neck:  No JVD, no thyromegally Back:  No CVA tenderness Lungs:  Clear with no wheezes HEART:  Regular rate rhythm, grade 2/6 systolic murmur, no rubs, no clicks Abd:  soft, positive bowel sounds, no organomegally, no rebound, no guarding Ext:  2 plus pulses, no edema, no cyanosis, no clubbing Skin:  No rashes no nodules Neuro:  CN II through XII intact, motor grossly intact  Holter monitor - NSR with 4 PVC's in 24 hours.  Assess/Plan:

## 2013-12-05 NOTE — Assessment & Plan Note (Signed)
Her blood pressure is slightly elevated. She will attempt to lose weight and continue her current meds.

## 2013-12-05 NOTE — Patient Instructions (Signed)
Your physician wants you to follow-up in: 6 months with Dr Taylor You will receive a reminder letter in the mail two months in advance. If you don't receive a letter, please call our office to schedule the follow-up appointment.  

## 2014-03-18 ENCOUNTER — Encounter: Payer: Self-pay | Admitting: Dietician

## 2014-03-18 ENCOUNTER — Encounter: Payer: BC Managed Care – PPO | Attending: Family Medicine | Admitting: Dietician

## 2014-03-18 VITALS — Ht 65.5 in | Wt 224.1 lb

## 2014-03-18 DIAGNOSIS — E669 Obesity, unspecified: Secondary | ICD-10-CM

## 2014-03-18 DIAGNOSIS — Z713 Dietary counseling and surveillance: Secondary | ICD-10-CM | POA: Insufficient documentation

## 2014-03-18 NOTE — Patient Instructions (Addendum)
Use My Plate by filling half your plate with non-starchy vegetables, 1/4 plate with lean protein, 1/4 plate with grains  Combine protein and carbohydrate for snacks (see handout) Avoid skipping meals, if you are busy at work eat a snack  Limit alcohol intake to 1 glass per day (made into 2 drinks if you mix with sparkling water)  Start walking 20 minutes 5 days a week

## 2014-03-18 NOTE — Progress Notes (Signed)
  Medical Nutrition Therapy:  Appt start time: 1430 end time:  1500.   Assessment:  Primary concerns today: Monica Marks is here to day because her cardiovascular doctor would like her to lose some weight. She has tried dieting in the past including weight watchers, but she said it seems to have the reverse effect and ends up causing weight gain. 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 Lives with her husband and 3 teenage children. She does the food shopping and preparation.Mainly grilling and baking food. Eats out for lunch about 4 times a week. She gave the example of Monica Marks's Kitchen or Monica Marks's. She doesn't eat dinner out often. After doctor's visit in March she stopped drinking diet sodas and limited caffeine. She is not currently exercising, but would like to start walking.  She reports her MD would prefer she avoid high intensity exercise, but that walking is okay. She states her goal weight is 175 lb. We discussed avoiding become overwhelmed with this goal and focusing on non scale victories.   Preferred Learning Style:   No preference indicated   Learning Readiness:   Ready   MEDICATIONS: See list   DIETARY INTAKE:  Usual eating pattern includes 3 meals and 0-1 snacks per day.  24-hr recall:  B ( AM): Eats around 10:30 am: scrambled eggs, vegetables with water  Snk ( AM): none  L ( PM): Eats around 3-4pm (sometimes work is busy): salad from [employer] with water, half sweet tea Snk ( PM): none  D ( PM): Chicken or tacos or burgers broccoli or asparagus or rice (she is currently avoiding potatoes) Snk ( PM): none  Beverages: water, half sweet tea, 1-2 glass of wine most nights   Does not usually snack. But if she does she may snack on some nuts.   Usual physical activity: none, but she is active at work lifting and moving furniture, etc.   Estimated energy needs: 1800 calories 200 g carbohydrates 135 g protein 50 g fat  Progress Towards Goal(s):   In progress.   Nutritional Diagnosis:  [place]-3.3 Overweight/obesity As related to excessive energy intake and sedentary .  As evidenced by BMI of 37.8 and diet recall.    Intervention:  Nutrition education.  Use My Plate by filling half your plate with non-starchy vegetables, 1/4 plate with lean protein, 1/4 plate with grains or starchy vegetables Combine protein and 15 gm carbohydrate for snacks (see handout) Avoid skipping meals, if you are busy at work eat a snack  Limit alcohol intake to 1 glass per day (made into 2 drinks if you mix with sparkling water)  Start walking 20 minutes 5 days a week   Teaching Method Utilized:  Visual Auditory   Handouts given during visit include:  15 gm Carb and protein snacks  My Plate   Barriers to learning/adherence to lifestyle change: none  Demonstrated degree of understanding via:  Teach Back   Monitoring/Evaluation:  Dietary intake, exercise, and body weight in 2 month(s).

## 2014-05-20 ENCOUNTER — Ambulatory Visit: Payer: BC Managed Care – PPO | Admitting: Dietician

## 2014-06-24 ENCOUNTER — Other Ambulatory Visit: Payer: Self-pay

## 2014-06-24 DIAGNOSIS — Z1239 Encounter for other screening for malignant neoplasm of breast: Secondary | ICD-10-CM

## 2014-06-24 DIAGNOSIS — Z9889 Other specified postprocedural states: Secondary | ICD-10-CM

## 2014-07-03 ENCOUNTER — Other Ambulatory Visit: Payer: Self-pay

## 2014-07-03 DIAGNOSIS — Z9889 Other specified postprocedural states: Secondary | ICD-10-CM

## 2014-07-03 DIAGNOSIS — Z1231 Encounter for screening mammogram for malignant neoplasm of breast: Secondary | ICD-10-CM

## 2014-07-08 ENCOUNTER — Other Ambulatory Visit: Payer: Self-pay | Admitting: Internal Medicine

## 2014-07-09 ENCOUNTER — Ambulatory Visit
Admission: RE | Admit: 2014-07-09 | Discharge: 2014-07-09 | Disposition: A | Discharge status: Home / Self Care | Payer: BC Managed Care – PPO | Source: Ambulatory Visit

## 2014-07-09 DIAGNOSIS — Z1231 Encounter for screening mammogram for malignant neoplasm of breast: Secondary | ICD-10-CM

## 2014-07-09 DIAGNOSIS — Z9889 Other specified postprocedural states: Secondary | ICD-10-CM

## 2014-07-14 ENCOUNTER — Encounter: Payer: Self-pay | Admitting: Dietician

## 2014-07-15 ENCOUNTER — Encounter: Payer: Self-pay | Admitting: Women's Health

## 2014-07-27 ENCOUNTER — Other Ambulatory Visit: Payer: Self-pay | Admitting: Internal Medicine

## 2014-07-31 ENCOUNTER — Other Ambulatory Visit: Payer: Self-pay | Admitting: *Deleted

## 2014-07-31 MED ORDER — METOPROLOL TARTRATE 50 MG PO TABS: 75.0000 mg | ORAL_TABLET | Freq: Two times a day (BID) | ORAL | Status: DC

## 2014-08-12 ENCOUNTER — Ambulatory Visit (INDEPENDENT_AMBULATORY_CARE_PROVIDER_SITE_OTHER): Payer: BC Managed Care – PPO | Admitting: Internal Medicine

## 2014-08-12 ENCOUNTER — Encounter: Payer: Self-pay | Admitting: Internal Medicine

## 2014-08-12 VITALS — BP 128/78 | HR 70 | Ht 65.0 in | Wt 229.8 lb

## 2014-08-12 DIAGNOSIS — I421 Obstructive hypertrophic cardiomyopathy: Secondary | ICD-10-CM

## 2014-08-12 DIAGNOSIS — R0600 Dyspnea, unspecified: Secondary | ICD-10-CM | POA: Insufficient documentation

## 2014-08-12 DIAGNOSIS — I10 Essential (primary) hypertension: Secondary | ICD-10-CM

## 2014-08-12 DIAGNOSIS — R002 Palpitations: Secondary | ICD-10-CM

## 2014-08-12 MED ORDER — LEVALBUTEROL TARTRATE 45 MCG/ACT IN AERO: 2.0000 | INHALATION_SPRAY | Freq: Two times a day (BID) | RESPIRATORY_TRACT | Status: DC

## 2014-08-12 NOTE — Assessment & Plan Note (Signed)
Her blood pressure is well controlled. She will continue her exercise regimen and beta blocker

## 2014-08-12 NOTE — Assessment & Plan Note (Signed)
I suspect the etiology is asthma and her sob is related to exertion. She has had some flares recently. I have considered adding a diuretic(lasix 20-40/day) and will do so if her dyspnea does not improve with Xopenex twice daily.

## 2014-08-12 NOTE — Patient Instructions (Addendum)
Your physician wants you to follow-up in: 12 months with Dr. Knox Saliva will receive a reminder letter in the mail two months in advance. If you don't receive a letter, please call our office to schedule the follow-up appointment.  Your physician has recommended you make the following change in your medication:  1) Xopenex ---take twice daily  Call [facility] and report how you are feeling in a few weeks  If no better will start Furosemide 20mg  daily

## 2014-08-12 NOTE — Progress Notes (Signed)
HPI Monica Marks returns today for followup. She is a pleasant 48 yo woman with obesity, HTN, and HOCM. She has not had syncope. Subsequent evaluation has demonstrated no sustained or non-sustained VT by holter monitoring, no hypotensive response to exercise on exercise treadmill testing, and good tolerance to taking beta blocker therapy. She has had dyspnea with exertion which began in October and has persisted. She notes that she has had asthma flares and has taken her bronchodilators intermittantly. She has been trying to exercise. She has not had peripheral edema. No PND or orthopnea. Also notes some weakness in her legs when she walks. She denies cough. No Known Allergies   Current Outpatient Prescriptions  Medication Sig Dispense Refill  . escitalopram (LEXAPRO) 20 MG tablet Take 20 mg by mouth daily.    Marland Kitchen latanoprost (XALATAN) 0.005 % ophthalmic solution Place 1 drop into both eyes at bedtime.    . metoprolol (LOPRESSOR) 50 MG tablet Take 1.5 tablets (75 mg total) by mouth 2 (two) times daily. 90 tablet 1  . Multiple Vitamins-Minerals (WOMENS MULTI VITAMIN & MINERAL PO) Take 1 tablet by mouth daily.     No current facility-administered medications for this visit.     Past Medical History  Diagnosis Date  . Fibroid   . Hypertension   . Asthma     bring inhaler dos  . Anxiety   . Headache(784.0)   . Glaucoma   . LVH (left ventricular hypertrophy)     TTE showed severe LVH with SAM & a resting LVOT gradient of 61mmHg which increased to 60mmHg with Valsalva   . SOB (shortness of breath) on exertion     During moderate exercise  . HOCM (hypertrophic obstructive cardiomyopathy)     ROS:   All systems reviewed and negative except as noted in the HPI.   Past Surgical History  Procedure Laterality Date  . Breast surgery  2006    BREAST LIFT  . Abdominoplasty  2006  . Cesarean section      3 TIMES  . Salpingoophorectomy  07/30/2012    Procedure: SALPINGO  OOPHORECTOMY;  Surgeon: Bennetta Laos, MD;  Location: [facility];  Service: Gynecology;  Laterality: Right;  . Laparoscopy  07/30/2012    Procedure: LAPAROSCOPY DIAGNOSTIC;  Surgeon: Bennetta Laos, MD;  Location: [facility];  Service: Gynecology;  Laterality: N/A;  attempted  . Abdominal hysterectomy  07/30/2012    Procedure: HYSTERECTOMY ABDOMINAL;  Surgeon: Bennetta Laos, MD;  Location: [facility];  Service: Gynecology;  Laterality: N/A;     Family History  Problem Relation Age of Onset  . Hypertension Mother   . Cancer Mother   . Lung cancer Father   . Melanoma Father   . Heart disease Father     HCM, s/p ICD implant  . Stroke Other      History   Social History  . Marital Status: Married    Spouse Name: N/A    Number of Children: N/A  . Years of Education: N/A   Occupational History  . Not on file.   Social History Main Topics  . Smoking status: Former Smoker -- 1.00 packs/day    Types: Cigarettes    Quit date: 05/25/2008  . Smokeless tobacco: Not on file  . Alcohol Use: Yes     Comment: occasional  . Drug Use: No  . Sexual Activity: Yes    Birth Control/ Protection: Surgical   Other Topics Concern  . Not  on file   Social History Narrative     BP 128/78 mmHg  Pulse 70  Ht 5\' 5"  (1.651 m)  Wt 229 lb 12.8 oz (104.237 kg)  BMI 38.24 kg/m2  LMP 07/23/2012  Physical Exam:  Well appearing middle aged woman, NAD HEENT: Unremarkable Neck:  No JVD, no thyromegally Back:  No CVA tenderness Lungs:  Clear with no wheezes HEART:  Regular rate rhythm, grade 2/6 systolic murmur, no rubs, no clicks Abd:  soft, positive bowel sounds, no organomegally, no rebound, no guarding Ext:  2 plus pulses, no edema, no cyanosis, no clubbing Skin:  No rashes no nodules Neuro:  CN II through XII intact, motor grossly intact  ECG - nsr with LVH and inferior STT abnormality, unchanged.  Assess/Plan:

## 2014-08-12 NOTE — Assessment & Plan Note (Signed)
She has had rare atrial and ventricular ectopy in the past and her symptoms are now controlled. Will follow. Avoid caffeine and ETOH.

## 2014-08-12 NOTE — Assessment & Plan Note (Signed)
Workup has been documented. She would like for her 48 yo daughter to be tested and will need an echo. For now she will continue her metoprolol.

## 2014-11-25 ENCOUNTER — Other Ambulatory Visit: Payer: Self-pay | Admitting: Internal Medicine

## 2014-12-24 ENCOUNTER — Emergency Department (HOSPITAL_COMMUNITY): Payer: BC Managed Care – PPO

## 2014-12-24 ENCOUNTER — Emergency Department (HOSPITAL_COMMUNITY)
Admission: EM | Admit: 2014-12-24 | Discharge: 2014-12-24 | Disposition: A | Discharge status: Home / Self Care | Payer: BC Managed Care – PPO | Attending: Emergency Medicine | Admitting: Emergency Medicine

## 2014-12-24 ENCOUNTER — Encounter (HOSPITAL_COMMUNITY): Payer: Self-pay | Admitting: *Deleted

## 2014-12-24 DIAGNOSIS — S61213A Laceration without foreign body of left middle finger without damage to nail, initial encounter: Secondary | ICD-10-CM | POA: Insufficient documentation

## 2014-12-24 DIAGNOSIS — Y288XXA Contact with other sharp object, undetermined intent, initial encounter: Secondary | ICD-10-CM | POA: Diagnosis not present

## 2014-12-24 DIAGNOSIS — Y9389 Activity, other specified: Secondary | ICD-10-CM | POA: Diagnosis not present

## 2014-12-24 DIAGNOSIS — F419 Anxiety disorder, unspecified: Secondary | ICD-10-CM | POA: Diagnosis not present

## 2014-12-24 DIAGNOSIS — Y998 Other external cause status: Secondary | ICD-10-CM | POA: Diagnosis not present

## 2014-12-24 DIAGNOSIS — J45909 Unspecified asthma, uncomplicated: Secondary | ICD-10-CM | POA: Insufficient documentation

## 2014-12-24 DIAGNOSIS — Z79899 Other long term (current) drug therapy: Secondary | ICD-10-CM | POA: Insufficient documentation

## 2014-12-24 DIAGNOSIS — Z87891 Personal history of nicotine dependence: Secondary | ICD-10-CM | POA: Diagnosis not present

## 2014-12-24 DIAGNOSIS — S61412A Laceration without foreign body of left hand, initial encounter: Secondary | ICD-10-CM | POA: Insufficient documentation

## 2014-12-24 DIAGNOSIS — H409 Unspecified glaucoma: Secondary | ICD-10-CM | POA: Diagnosis not present

## 2014-12-24 DIAGNOSIS — I10 Essential (primary) hypertension: Secondary | ICD-10-CM | POA: Diagnosis not present

## 2014-12-24 DIAGNOSIS — Y9289 Other specified places as the place of occurrence of the external cause: Secondary | ICD-10-CM | POA: Insufficient documentation

## 2014-12-24 DIAGNOSIS — Z8742 Personal history of other diseases of the female genital tract: Secondary | ICD-10-CM | POA: Diagnosis not present

## 2014-12-24 DIAGNOSIS — S6992XA Unspecified injury of left wrist, hand and finger(s), initial encounter: Secondary | ICD-10-CM | POA: Diagnosis present

## 2014-12-24 MED ORDER — ONDANSETRON HCL 4 MG/2ML IJ SOLN
4.0000 mg | Freq: Once | INTRAMUSCULAR | Status: AC
  Administered 2014-12-24: 4 mg via INTRAVENOUS
  Filled 2014-12-24: qty 2

## 2014-12-24 MED ORDER — MORPHINE SULFATE 4 MG/ML IJ SOLN
4.0000 mg | Freq: Once | INTRAMUSCULAR | Status: AC
Start: 2014-12-24 — End: 2014-12-24
  Administered 2014-12-24: 4 mg via INTRAVENOUS
  Filled 2014-12-24: qty 1

## 2014-12-24 MED ORDER — CEPHALEXIN 500 MG PO CAPS: 500.0000 mg | ORAL_CAPSULE | Freq: Two times a day (BID) | ORAL | Status: DC

## 2014-12-24 MED ORDER — BACITRACIN 500 UNIT/GM EX OINT
2.0000 "application " | TOPICAL_OINTMENT | Freq: Once | CUTANEOUS | Status: AC
  Administered 2014-12-24: 2 via TOPICAL
  Filled 2014-12-24: qty 0.9

## 2014-12-24 MED ORDER — LIDOCAINE-EPINEPHRINE (PF) 2 %-1:200000 IJ SOLN
20.0000 mL | Freq: Once | INTRAMUSCULAR | Status: AC
  Administered 2014-12-24: 20 mL via INTRADERMAL
  Filled 2014-12-24: qty 20

## 2014-12-24 MED ORDER — CEPHALEXIN 500 MG PO CAPS
500.0000 mg | ORAL_CAPSULE | Freq: Once | ORAL | Status: AC
  Administered 2014-12-24: 500 mg via ORAL
  Filled 2014-12-24: qty 1

## 2014-12-24 NOTE — Discharge Instructions (Signed)
Every attempt was made to remove foreign body (contaminants) from the wound.  However, there is always a chance that some may remain in the wound. This can  increase your risk of infection.   If you see signs of infection (warmth, redness, tenderness, pus, sharp increase in pain, fever, red streaking in the skin) immediately return to the emergency department.  Use your splint for the first 3-5 days.   Elevate and apply ice for the first 2-3 days then transition to heat on the hematoma. Keep the hand elevated above the level of the heart as much as possible.    Keep wound dry and do not remove dressing for 24 hours if possible. After that, wash gently morning and night (every 12 hours) with soap and water. Use a topical antibiotic ointment and cover with a bandaid or gauze.    Do NOT use rubbing alcohol or hydrogen peroxide, do not soak the area   Present to your primary care doctor or the urgent care of your choice, or the ED for suture removal in 10 days.   After the wound heals fully, apply sunscreen for 6-12 months to minimize scarring.   Laceration Care, Adult A laceration is a cut or lesion that goes through all layers of the skin and into the tissue just beneath the skin. TREATMENT  Some lacerations may not require closure. Some lacerations may not be able to be closed due to an increased risk of infection. It is important to see your caregiver as soon as possible after an injury to minimize the risk of infection and maximize the opportunity for successful closure. If closure is appropriate, pain medicines may be given, if needed. The wound will be cleaned to help prevent infection. Your caregiver will use stitches (sutures), staples, wound glue (adhesive), or skin adhesive strips to repair the laceration. These tools bring the skin edges together to allow for faster healing and a better cosmetic outcome. However, all wounds will heal with a scar. Once the wound has healed, scarring can be  minimized by covering the wound with sunscreen during the day for 1 full year. HOME CARE INSTRUCTIONS  For sutures or staples:  Keep the wound clean and dry.  If you were given a bandage (dressing), you should change it at least once a day. Also, change the dressing if it becomes wet or dirty, or as directed by your caregiver.  Wash the wound with soap and water 2 times a day. Rinse the wound off with water to remove all soap. Pat the wound dry with a clean towel.  After cleaning, apply a thin layer of the antibiotic ointment as recommended by your caregiver. This will help prevent infection and keep the dressing from sticking.  You may shower as usual after the first 24 hours. Do not soak the wound in water until the sutures are removed.  Only take over-the-counter or prescription medicines for pain, discomfort, or fever as directed by your caregiver.  Get your sutures or staples removed as directed by your caregiver. For skin adhesive strips:  Keep the wound clean and dry.  Do not get the skin adhesive strips wet. You may bathe carefully, using caution to keep the wound dry.  If the wound gets wet, pat it dry with a clean towel.  Skin adhesive strips will fall off on their own. You may trim the strips as the wound heals. Do not remove skin adhesive strips that are still stuck to the wound. They will  fall off in time. For wound adhesive:  You may briefly wet your wound in the shower or bath. Do not soak or scrub the wound. Do not swim. Avoid periods of heavy perspiration until the skin adhesive has fallen off on its own. After showering or bathing, gently pat the wound dry with a clean towel.  Do not apply liquid medicine, cream medicine, or ointment medicine to your wound while the skin adhesive is in place. This may loosen the film before your wound is healed.  If a dressing is placed over the wound, be careful not to apply tape directly over the skin adhesive. This may cause the  adhesive to be pulled off before the wound is healed.  Avoid prolonged exposure to sunlight or tanning lamps while the skin adhesive is in place. Exposure to ultraviolet light in the first year will darken the scar.  The skin adhesive will usually remain in place for 5 to 10 days, then naturally fall off the skin. Do not pick at the adhesive film. You may need a tetanus shot if:  You cannot remember when you had your last tetanus shot.  You have never had a tetanus shot. If you get a tetanus shot, your arm may swell, get red, and feel warm to the touch. This is common and not a problem. If you need a tetanus shot and you choose not to have one, there is a rare chance of getting tetanus. Sickness from tetanus can be serious. SEEK MEDICAL CARE IF:   You have redness, swelling, or increasing pain in the wound.  You see a red line that goes away from the wound.  You have yellowish-white fluid (pus) coming from the wound.  You have a fever.  You notice a bad smell coming from the wound or dressing.  Your wound breaks open before or after sutures have been removed.  You notice something coming out of the wound such as wood or glass.  Your wound is on your hand or foot and you cannot move a finger or toe. SEEK IMMEDIATE MEDICAL CARE IF:   Your pain is not controlled with prescribed medicine.  You have severe swelling around the wound causing pain and numbness or a change in color in your arm, hand, leg, or foot.  Your wound splits open and starts bleeding.  You have worsening numbness, weakness, or loss of function of any joint around or beyond the wound.  You develop painful lumps near the wound or on the skin anywhere on your body. MAKE SURE YOU:   Understand these instructions.  Will watch your condition.  Will get help right away if you are not doing well or get worse. Document Released: 08/29/2005 Document Revised: 11/21/2011 Document Reviewed: 02/22/2011 [facility]  Patient Information 2015 [place], [place]. This information is not intended to replace advice given to you by your health care provider. Make sure you discuss any questions you have with your health care provider.

## 2014-12-24 NOTE — ED Notes (Signed)
Patient transported to X-ray 

## 2014-12-24 NOTE — ED Notes (Signed)
Per EMS, pt has degloving injury to her right middle finger that occurred when she was cleaning a compression fan. Pt also has lacerations to index, ring and pinky finger. Pt given 186mcg fentanyl by EMS, pain is now 3/10. Bleeding controlled on the scene, EMS state bleeding appeared venous.

## 2014-12-24 NOTE — ED Provider Notes (Signed)
CSN: [id number]     Arrival date & time 12/24/14  1336 History   First MD Initiated Contact with Patient 12/24/14 1342     Chief Complaint  Patient presents with  . Hand Injury     (Consider location/radiation/quality/duration/timing/severity/associated sxs/prior Treatment) HPI   Monica Marks is a 49 y.o. female complaining of a hand injury.  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  She describes the pain as a 7/10, exacerbated by movement and palpation. but she says she is able to move the fingers and has sensation in them.  She is right handed.  She had a tetanus shot 1 month ago.     Past Medical History  Diagnosis Date  . Fibroid   . Hypertension   . Asthma     bring inhaler dos  . Anxiety   . Headache(784.0)   . Glaucoma   . LVH (left ventricular hypertrophy)     TTE showed severe LVH with SAM & a resting LVOT gradient of 76mmHg which increased to 42mmHg with Valsalva   . SOB (shortness of breath) on exertion     During moderate exercise  . HOCM (hypertrophic obstructive cardiomyopathy)    Past Surgical History  Procedure Laterality Date  . Breast surgery  2006    BREAST LIFT  . Abdominoplasty  2006  . Cesarean section      3 TIMES  . Salpingoophorectomy  07/30/2012    Procedure: SALPINGO OOPHORECTOMY;  Surgeon: Bennetta Laos, MD;  Location: [facility];  Service: Gynecology;  Laterality: Right;  . Laparoscopy  07/30/2012    Procedure: LAPAROSCOPY DIAGNOSTIC;  Surgeon: Bennetta Laos, MD;  Location: [facility];  Service: Gynecology;  Laterality: N/A;  attempted  . Abdominal hysterectomy  07/30/2012    Procedure: HYSTERECTOMY ABDOMINAL;  Surgeon: Bennetta Laos, MD;  Location: [facility];  Service: Gynecology;  Laterality: N/A;   Family History  Problem Relation Age of Onset  . Hypertension Mother   . Cancer Mother   .  Lung cancer Father   . Melanoma Father   . Heart disease Father     HCM, s/p ICD implant  . Stroke Other    History  Substance Use Topics  . Smoking status: Former Smoker -- 1.00 packs/day    Types: Cigarettes    Quit date: 05/25/2008  . Smokeless tobacco: Not on file  . Alcohol Use: Yes     Comment: occasional   OB History    Gravida Para Term Preterm AB TAB SAB Ectopic Multiple Living   5 3 3       3      Review of Systems  10 systems reviewed and found to be negative, except as noted in the HPI.   Allergies  Review of patient's allergies indicates no known allergies.  Home Medications   Prior to Admission medications   Medication Sig Start Date End Date Taking? Authorizing Provider  escitalopram (LEXAPRO) 20 MG tablet Take 20 mg by mouth daily.   Yes Historical Provider, MD  latanoprost (XALATAN) 0.005 % ophthalmic solution Place 1 drop into both eyes at bedtime.   Yes Historical Provider, MD  levalbuterol [facility] HFA) 45 MCG/ACT inhaler Inhale 2 puffs into the lungs 2 (two) times daily at 10 AM and 5 PM. 08/12/14  Yes Evans Lance, MD  metoprolol Tonia [place])  50 MG tablet Take 1.5 tablets (75 mg total) by mouth 2 (two) times daily. 07/31/14  Yes Evans Lance, MD  Multiple Vitamins-Minerals (WOMENS MULTI VITAMIN & MINERAL PO) Take 1 tablet by mouth daily.   Yes Historical Provider, MD  metoprolol (LOPRESSOR) 50 MG tablet TAKE ONE AND ONE-HALF TABLETS BY MOUTH TWICE DAILY  11/25/14   Evans Lance, MD   BP 145/80 mmHg  Pulse 71  Temp(Src) 97.9 F (36.6 C) (Oral)  Resp 16  SpO2 95%  LMP 07/23/2012 Physical Exam  Constitutional: She is oriented to person, place, and time. She appears well-developed and well-nourished. No distress.  HENT:  Head: Normocephalic.  Eyes: Conjunctivae and EOM are normal.  Cardiovascular: Normal rate.   Pulmonary/Chest: Effort normal. No stridor.  Musculoskeletal: Normal range of motion.       Hands: 3 cm full thickness laceration to  dorsum of left 4th metacarpal area, bleeding is controlled. Multiple shards  Of metallic paint like material . FROM, NVI  2 cmm full thickness jagged laceration with active venous bleeding to dorsum of left 3rd  Proximal metacarpal. + multiple small metallic appearing particles.   Neurovacularly intact with full ROM and strength to each interphalangeal joint (tested in isolation) in both flexion and extension.    Neurological: She is alert and oriented to person, place, and time.  Psychiatric: She has a normal mood and affect.  Nursing note and vitals reviewed.   ED Course  LACERATION REPAIR Date/Time: 12/25/2014 6:20 AM Performed by: Monico Blitz Authorized by: Monico Blitz Consent: Verbal consent obtained. Consent given by: patient Required items: required blood products, implants, devices, and special equipment available Patient identity confirmed: verbally with patient Time out: Immediately prior to procedure a "time out" was called to verify the correct patient, procedure, equipment, support staff and site/side marked as required. Body area: upper extremity Location details: left hand Laceration length: 2 cm Contamination: The wound is contaminated. Foreign bodies: unknown (Multiple small metallic iridescent pieces consistent with paint chips) Tendon involvement: none Nerve involvement: none Vascular damage: no Anesthesia: local infiltration Local anesthetic: lidocaine 2% with epinephrine Anesthetic total: 3 ml Patient sedated: no Preparation: Patient was prepped and draped in the usual sterile fashion. Irrigation solution: saline Irrigation method: syringe Amount of cleaning: extensive Debridement: moderate Degree of undermining: none Skin closure: Ethilon (4-0) Number of sutures: 3 Technique: simple Approximation: close Approximation difficulty: complex Dressing: antibiotic ointment Patient tolerance: Patient tolerated the procedure well with no immediate  complications    LACERATION REPAIR Date/Time: 12/25/2014 6:20 AM Performed by: Monico Blitz Authorized by: Monico Blitz Consent: Verbal consent obtained. Consent given by: patient Required items: required blood products, implants, devices, and special equipment available Patient identity confirmed: verbally with patient Time out: Immediately prior to procedure a "time out" was called to verify the correct patient, procedure, equipment, support staff and site/side marked as required. Body area: upper extremity Location details: left hand Laceration length: 3 cm Contamination: The wound is contaminated. Foreign bodies: unknown (Multiple small metallic iridescent pieces consistent with paint chips) Tendon involvement: none Nerve involvement: none Vascular damage: no Anesthesia: local infiltration Local anesthetic: lidocaine 2% with epinephrine Anesthetic total: 3 ml Patient sedated: no Preparation: Patient was prepped and draped in the usual sterile fashion. Irrigation solution: saline Irrigation method: syringe Amount of cleaning: extensive Debridement: moderate Degree of undermining: none Skin closure: Ethilon (4-0) Number of sutures: 7 Technique: simple Approximation: close Approximation difficulty: complex Dressing: antibiotic ointment Patient tolerance: Patient tolerated the procedure well  with no immediate complications   Labs Review Labs Reviewed - No data to display  Imaging Review No results found.   EKG Interpretation None      MDM   Final diagnoses:  Hand laceration, left, initial encounter    Filed Vitals:   12/24/14 1340 12/24/14 1533 12/24/14 1649  BP: 145/80 112/65 110/60  Pulse: 71 64 71  Temp: 97.9 F (36.6 C) 97.8 F (36.6 C)   TempSrc: Oral Oral   Resp: 16 16   SpO2: 95% 97% 97%    Medications  morphine 4 MG/ML injection 4 mg (4 mg Intravenous Given 12/24/14 1431)  ondansetron (ZOFRAN) injection 4 mg (4 mg Intravenous  Given 12/24/14 1432)  lidocaine-EPINEPHrine (XYLOCAINE W/EPI) 2 %-1:200000 (PF) injection 20 mL (20 mLs Intradermal Given 12/24/14 1432)  cephALEXin (KEFLEX) capsule 500 mg (500 mg Oral Given 12/24/14 1626)  bacitracin ointment 2 application (2 application Topical Given 12/24/14 1627)    Niambi Smoak is a pleasant 49 y.o. female presenting with several deep lacerations to dorsum of left hand. Neurovascularly intact with full range of motion and full strength. Wound is irrigated and multiple small metallic particles are removed from both wounds. Wound is closed and patient started on Keflex prophylactically.Extensive discussion of wound care precautions.  Evaluation does not show pathology that would require ongoing emergent intervention or inpatient treatment. Pt is hemodynamically stable and mentating appropriately. Discussed findings and plan with patient/guardian, who agrees with care plan. All questions answered. Return precautions discussed and outpatient follow up given.   Discharge Medication List as of 12/24/2014  4:13 PM    START taking these medications   Details  cephALEXin (KEFLEX) 500 MG capsule Take 1 capsule (500 mg total) by mouth 2 (two) times daily., Starting 12/24/2014, Until Discontinued, Print             Monico Blitz, PA-C 12/25/14 3295  Milton Ferguson, MD 12/25/14 [phone]

## 2014-12-24 NOTE — ED Notes (Signed)
Bed: WA09 Expected date:  Expected time:  Means of arrival:  Comments: degloving

## 2015-05-27 ENCOUNTER — Other Ambulatory Visit: Payer: Self-pay | Admitting: Gastroenterology

## 2015-05-27 HISTORY — PX: COLONOSCOPY W/ POLYPECTOMY: SHX1380

## 2015-06-09 ENCOUNTER — Observation Stay (HOSPITAL_COMMUNITY)
Admission: EM | Admit: 2015-06-09 | Discharge: 2015-06-10 | Disposition: A | Discharge status: Home / Self Care | Payer: BC Managed Care – PPO | Attending: Internal Medicine | Admitting: Internal Medicine

## 2015-06-09 ENCOUNTER — Encounter (HOSPITAL_COMMUNITY): Payer: Self-pay | Admitting: Emergency Medicine

## 2015-06-09 DIAGNOSIS — I421 Obstructive hypertrophic cardiomyopathy: Secondary | ICD-10-CM | POA: Diagnosis not present

## 2015-06-09 DIAGNOSIS — K922 Gastrointestinal hemorrhage, unspecified: Secondary | ICD-10-CM | POA: Diagnosis present

## 2015-06-09 DIAGNOSIS — Z87891 Personal history of nicotine dependence: Secondary | ICD-10-CM | POA: Diagnosis not present

## 2015-06-09 DIAGNOSIS — H409 Unspecified glaucoma: Secondary | ICD-10-CM | POA: Insufficient documentation

## 2015-06-09 DIAGNOSIS — I10 Essential (primary) hypertension: Secondary | ICD-10-CM | POA: Diagnosis not present

## 2015-06-09 DIAGNOSIS — K644 Residual hemorrhoidal skin tags: Secondary | ICD-10-CM | POA: Diagnosis not present

## 2015-06-09 DIAGNOSIS — K91840 Postprocedural hemorrhage and hematoma of a digestive system organ or structure following a digestive system procedure: Secondary | ICD-10-CM | POA: Diagnosis not present

## 2015-06-09 DIAGNOSIS — Z791 Long term (current) use of non-steroidal anti-inflammatories (NSAID): Secondary | ICD-10-CM | POA: Diagnosis not present

## 2015-06-09 DIAGNOSIS — I517 Cardiomegaly: Secondary | ICD-10-CM | POA: Diagnosis not present

## 2015-06-09 DIAGNOSIS — Z79899 Other long term (current) drug therapy: Secondary | ICD-10-CM | POA: Diagnosis not present

## 2015-06-09 DIAGNOSIS — Z23 Encounter for immunization: Secondary | ICD-10-CM | POA: Diagnosis not present

## 2015-06-09 DIAGNOSIS — D62 Acute posthemorrhagic anemia: Secondary | ICD-10-CM | POA: Diagnosis present

## 2015-06-09 DIAGNOSIS — J45909 Unspecified asthma, uncomplicated: Secondary | ICD-10-CM | POA: Diagnosis present

## 2015-06-09 DIAGNOSIS — Z86010 Personal history of colonic polyps: Secondary | ICD-10-CM | POA: Diagnosis not present

## 2015-06-09 DIAGNOSIS — F419 Anxiety disorder, unspecified: Secondary | ICD-10-CM | POA: Insufficient documentation

## 2015-06-09 LAB — URINALYSIS, ROUTINE W REFLEX MICROSCOPIC
BILIRUBIN URINE: NEGATIVE
GLUCOSE, UA: NEGATIVE mg/dL
Hgb urine dipstick: NEGATIVE
KETONES UR: 15 mg/dL — AB
Leukocytes, UA: NEGATIVE
NITRITE: NEGATIVE
PH: 7.5 (ref 5.0–8.0)
PROTEIN: NEGATIVE mg/dL
Specific Gravity, Urine: 1.007 (ref 1.005–1.030)
Urobilinogen, UA: 0.2 mg/dL (ref 0.0–1.0)

## 2015-06-09 LAB — CBC
HCT: 34.8 % — ABNORMAL LOW (ref 36.0–46.0)
HCT: 34.4 % — ABNORMAL LOW (ref 36.0–46.0)
HCT: 33 % — ABNORMAL LOW (ref 36.0–46.0)
HEMOGLOBIN: 11.1 g/dL — AB (ref 12.0–15.0)
Hemoglobin: 11.8 g/dL — ABNORMAL LOW (ref 12.0–15.0)
Hemoglobin: 11.9 g/dL — ABNORMAL LOW (ref 12.0–15.0)
MCH: 30.7 pg (ref 26.0–34.0)
MCH: 31.3 pg (ref 26.0–34.0)
MCH: 30.5 pg (ref 26.0–34.0)
MCHC: 33.9 g/dL (ref 30.0–36.0)
MCHC: 34.6 g/dL (ref 30.0–36.0)
MCHC: 33.6 g/dL (ref 30.0–36.0)
MCV: 90.6 fL (ref 78.0–100.0)
MCV: 90.5 fL (ref 78.0–100.0)
MCV: 90.7 fL (ref 78.0–100.0)
PLATELETS: 199 10*3/uL (ref 150–400)
Platelets: 195 10*3/uL (ref 150–400)
Platelets: 174 10*3/uL (ref 150–400)
RBC: 3.84 MIL/uL — AB (ref 3.87–5.11)
RBC: 3.8 MIL/uL — ABNORMAL LOW (ref 3.87–5.11)
RBC: 3.64 MIL/uL — AB (ref 3.87–5.11)
RDW: 12.8 % (ref 11.5–15.5)
RDW: 12.8 % (ref 11.5–15.5)
RDW: 12.9 % (ref 11.5–15.5)
WBC: 6.5 10*3/uL (ref 4.0–10.5)
WBC: 7.2 10*3/uL (ref 4.0–10.5)
WBC: 8.3 10*3/uL (ref 4.0–10.5)

## 2015-06-09 LAB — COMPREHENSIVE METABOLIC PANEL
ALBUMIN: 4.5 g/dL (ref 3.5–5.0)
ALT: 29 U/L (ref 14–54)
AST: 23 U/L (ref 15–41)
Alkaline Phosphatase: 77 U/L (ref 38–126)
Anion gap: 8 (ref 5–15)
BUN: 10 mg/dL (ref 6–20)
CHLORIDE: 102 mmol/L (ref 101–111)
CO2: 26 mmol/L (ref 22–32)
CREATININE: 0.58 mg/dL (ref 0.44–1.00)
Calcium: 9.1 mg/dL (ref 8.9–10.3)
GFR calc non Af Amer: >60 mL/min (ref >60)
GLUCOSE: 100 mg/dL — AB (ref 65–99)
Potassium: 4.2 mmol/L (ref 3.5–5.1)
SODIUM: 136 mmol/L (ref 135–145)
Total Bilirubin: 0.7 mg/dL (ref 0.3–1.2)
Total Protein: 7.1 g/dL (ref 6.5–8.1)

## 2015-06-09 LAB — TYPE AND SCREEN
ABO/RH(D): O POS
Antibody Screen: NEGATIVE

## 2015-06-09 LAB — ABO/RH: ABO/RH(D): O POS

## 2015-06-09 LAB — POC OCCULT BLOOD, ED: FECAL OCCULT BLD: POSITIVE — AB

## 2015-06-09 LAB — PROTIME-INR
INR: 1.07 (ref 0.00–1.49)
Prothrombin Time: 14.1 seconds (ref 11.6–15.2)

## 2015-06-09 MED ORDER — MORPHINE SULFATE (PF) 2 MG/ML IV SOLN: 1.0000 mg | INTRAVENOUS | Status: DC | PRN

## 2015-06-09 MED ORDER — ONDANSETRON HCL 4 MG/2ML IJ SOLN: 4.0000 mg | Freq: Four times a day (QID) | INTRAMUSCULAR | Status: DC | PRN

## 2015-06-09 MED ORDER — SENNOSIDES-DOCUSATE SODIUM 8.6-50 MG PO TABS: 1.0000 | ORAL_TABLET | Freq: Every evening | ORAL | Status: DC | PRN

## 2015-06-09 MED ORDER — LATANOPROST 0.005 % OP SOLN
1.0000 [drp] | Freq: Every day | OPHTHALMIC | Status: DC
  Administered 2015-06-09: 1 [drp] via OPHTHALMIC
  Filled 2015-06-09: qty 2.5

## 2015-06-09 MED ORDER — ONDANSETRON HCL 4 MG PO TABS: 4.0000 mg | ORAL_TABLET | Freq: Four times a day (QID) | ORAL | Status: DC | PRN

## 2015-06-09 MED ORDER — OXYCODONE HCL 5 MG PO TABS: 5.0000 mg | ORAL_TABLET | ORAL | Status: DC | PRN

## 2015-06-09 MED ORDER — DIPHENHYDRAMINE HCL 50 MG PO CAPS
50.0000 mg | ORAL_CAPSULE | Freq: Once | ORAL | Status: AC
  Administered 2015-06-09: 50 mg via ORAL
  Filled 2015-06-09: qty 1

## 2015-06-09 MED ORDER — PANTOPRAZOLE SODIUM 40 MG IV SOLR: 40.0000 mg | Freq: Two times a day (BID) | INTRAVENOUS | Status: DC

## 2015-06-09 MED ORDER — ESCITALOPRAM OXALATE 20 MG PO TABS
20.0000 mg | ORAL_TABLET | Freq: Every day | ORAL | Status: DC
  Administered 2015-06-10: 20 mg via ORAL
  Filled 2015-06-09: qty 1

## 2015-06-09 MED ORDER — IPRATROPIUM BROMIDE 0.02 % IN SOLN: 0.5000 mg | RESPIRATORY_TRACT | Status: DC | PRN

## 2015-06-09 MED ORDER — SORBITOL 70 % SOLN: 30.0000 mL | Freq: Every day | Status: DC | PRN

## 2015-06-09 MED ORDER — METOPROLOL TARTRATE 50 MG PO TABS
75.0000 mg | ORAL_TABLET | Freq: Two times a day (BID) | ORAL | Status: DC
  Administered 2015-06-09 – 2015-06-10 (×2): 75 mg via ORAL
  Filled 2015-06-09 (×4): qty 1

## 2015-06-09 MED ORDER — SODIUM CHLORIDE 0.9 % IV SOLN
INTRAVENOUS | Status: AC
  Administered 2015-06-09 – 2015-06-10 (×2): via INTRAVENOUS

## 2015-06-09 MED ORDER — SODIUM CHLORIDE 0.9 % IV SOLN
80.0000 mg | Freq: Once | INTRAVENOUS | Status: AC
  Administered 2015-06-09: 80 mg via INTRAVENOUS
  Filled 2015-06-09: qty 80

## 2015-06-09 MED ORDER — ACETAMINOPHEN 650 MG RE SUPP: 650.0000 mg | Freq: Four times a day (QID) | RECTAL | Status: DC | PRN

## 2015-06-09 MED ORDER — SODIUM CHLORIDE 0.9 % IJ SOLN: 3.0000 mL | Freq: Two times a day (BID) | INTRAMUSCULAR | Status: DC

## 2015-06-09 MED ORDER — ACETAMINOPHEN 325 MG PO TABS: 650.0000 mg | ORAL_TABLET | Freq: Four times a day (QID) | ORAL | Status: DC | PRN

## 2015-06-09 MED ORDER — INFLUENZA VAC SPLIT QUAD 0.5 ML IM SUSY
0.5000 mL | PREFILLED_SYRINGE | INTRAMUSCULAR | Status: AC
  Administered 2015-06-10: 0.5 mL via INTRAMUSCULAR
  Filled 2015-06-09 (×2): qty 0.5

## 2015-06-09 MED ORDER — LEVALBUTEROL HCL 0.63 MG/3ML IN NEBU: 0.6300 mg | INHALATION_SOLUTION | RESPIRATORY_TRACT | Status: DC | PRN

## 2015-06-09 NOTE — H&P (Signed)
[facility] History and Physical  Monica Marks OJJ:[id number] DOB: 11/27/1965 DOA: 06/09/2015  Referring physician: Dr Ralene Bathe PCP: London Pepper, MD   Chief Complaint: Rectal bleeding  HPI: Monica Marks is a 49 y.o. female  With history of hypertension, hypertrophic obstructive cardiomyopathy,  Fibroids, asthma, anxiety, who presents to the ED with maroon colored stools which started at 4 PM today prior to admission. Patient stated had 6 bloody bowel movements the day prior to admission. On the morning of admission patient had 2 more maroon colored stools with some associated abdominal cramping some lightheadedness and subsequently presented to the ED. Patient does endorse some use of IV pro-last use was 2 days prior to admission when she took 600 mg. Patient denies any fevers, no chills, no chest pain, no shortness of breath, no nausea, no vomiting, no dysuria, no weakness, no hematemesis, no melanotic, no cough, no history of peptic ulcer disease. Patient stated on 05/27/2015 had a polypectomy without any complications per her gastroenterologist. Patient was seen in the ED CBC obtained had a hemoglobin of 11.8 otherwise was within normal limits. Comprehensive metabolic profile is unremarkable. FOBT was positive. The ED physician blood was noted in the rectal vault. [facility] call to admit the patient for further evaluation and management.   Review of Systems: Per history of present illness otherwise negative. Constitutional:  No weight loss, night sweats, Fevers, chills, fatigue.  HEENT:  No headaches, Difficulty swallowing,Tooth/dental problems,Sore throat,  No sneezing, itching, ear ache, nasal congestion, post nasal drip,  Cardio-vascular:  No chest pain, Orthopnea, PND, swelling in lower extremities, anasarca, dizziness, palpitations  GI:  No heartburn, indigestion, abdominal pain, nausea, vomiting, diarrhea, change in bowel habits, loss of appetite  Resp:   No shortness of breath with exertion or at rest. No excess mucus, no productive cough, No non-productive cough, No coughing up of blood.No change in color of mucus.No wheezing.No chest wall deformity  Skin:  no rash or lesions.  GU:  no dysuria, change in color of urine, no urgency or frequency. No flank pain.  Musculoskeletal:  No joint pain or swelling. No decreased range of motion. No back pain.  Psych:  No change in mood or affect. No depression or anxiety. No memory loss.   Past Medical History  Diagnosis Date  . Fibroid   . Hypertension   . Asthma     bring inhaler dos  . Anxiety   . Headache(784.0)   . Glaucoma   . LVH (left ventricular hypertrophy)     TTE showed severe LVH with SAM & a resting LVOT gradient of 81mmHg which increased to 33mmHg with Valsalva   . SOB (shortness of breath) on exertion     During moderate exercise  . HOCM (hypertrophic obstructive cardiomyopathy)    Past Surgical History  Procedure Laterality Date  . Breast surgery  2006    BREAST LIFT  . Abdominoplasty  2006  . Cesarean section      3 TIMES  . Salpingoophorectomy  07/30/2012    Procedure: SALPINGO OOPHORECTOMY;  Surgeon: Bennetta Laos, MD;  Location: [facility];  Service: Gynecology;  Laterality: Right;  . Laparoscopy  07/30/2012    Procedure: LAPAROSCOPY DIAGNOSTIC;  Surgeon: Bennetta Laos, MD;  Location: [facility];  Service: Gynecology;  Laterality: N/A;  attempted  . Abdominal hysterectomy  07/30/2012    Procedure: HYSTERECTOMY ABDOMINAL;  Surgeon: Bennetta Laos, MD;  Location: [facility];  Service: Gynecology;  Laterality: N/A;  . Colonoscopy  w/ polypectomy  05/27/2015    With biopsy   Social History:  reports that she quit smoking about 7 years ago. Her smoking use included Cigarettes. She smoked 1.00 pack per day. She does not have any smokeless tobacco history on file. She reports that she drinks alcohol. She reports that she does not use illicit drugs.  No Known  Allergies  Family History  Problem Relation Age of Onset  . Hypertension Mother   . Cancer Mother   . Lung cancer Father   . Melanoma Father   . Heart disease Father     HCM, s/p ICD implant  . Stroke Other    mother deceased at age 74 from lung cancer. Father alive age 56 with cardiomyopathy and arrhythmias and history of peptic ulcer disease.  Prior to Admission medications   Medication Sig Start Date End Date Taking? Authorizing Provider  escitalopram (LEXAPRO) 20 MG tablet Take 20 mg by mouth daily.   Yes Historical Provider, MD  ibuprofen (ADVIL,MOTRIN) 200 MG tablet Take 600 mg by mouth every 6 (six) hours as needed for moderate pain.   Yes Historical Provider, MD  latanoprost (XALATAN) 0.005 % ophthalmic solution Place 1 drop into both eyes at bedtime.   Yes Historical Provider, MD  metoprolol (LOPRESSOR) 50 MG tablet Take 1.5 tablets (75 mg total) by mouth 2 (two) times daily. 07/31/14  Yes Evans Lance, MD  Multiple Vitamins-Minerals (WOMENS MULTI VITAMIN & MINERAL PO) Take 1 tablet by mouth daily.   Yes Historical Provider, MD  VENTOLIN HFA 108 (90 BASE) MCG/ACT inhaler INHALE 1 TO 2 PUFFS EVERY 4 TO 6 HOURS IF NEEDED FOR WHEEZING 04/23/15  Yes Historical Provider, MD  cephALEXin (KEFLEX) 500 MG capsule Take 1 capsule (500 mg total) by mouth 2 (two) times daily. Patient not taking: Reported on 06/09/2015 12/24/14   Carman Ching, PA-C  levalbuterol [facility] HFA) 45 MCG/ACT inhaler Inhale 2 puffs into the lungs 2 (two) times daily at 10 AM and 5 PM. Patient not taking: Reported on 06/09/2015 08/12/14   Evans Lance, MD  metoprolol (LOPRESSOR) 50 MG tablet TAKE ONE AND ONE-HALF TABLETS BY MOUTH TWICE DAILY  Patient not taking: Reported on 06/09/2015 11/25/14   Evans Lance, MD   Physical Exam: Filed Vitals:   06/09/15 1008 06/09/15 1200 06/09/15 1300  BP: 138/69 120/67 120/59  Pulse: 77 66 73  Temp: 97.7 F (36.5 C)    TempSrc: Oral    Resp: 18 12 11   SpO2: 100% 95% 96%     Wt Readings from Last 3 Encounters:  08/12/14 104.237 kg (229 lb 12.8 oz)  03/18/14 101.651 kg (224 lb 1.6 oz)  12/05/13 100.699 kg (222 lb)    General:  Well-developed well-nourished laying on gurney no acute cardiopulmonary distress. Speaking in full sentences.  Eyes: PERRLA, EOMI, normal lids, irises & conjunctiva ENT: grossly normal hearing, lips & tongue Neck: no LAD, masses or thyromegaly Cardiovascular: RRR, no m/r/g. No LE edema. Respiratory: CTA bilaterally, no w/r/r. Normal respiratory effort. Abdomen: soft, ntnd, positive bowel sounds, no rebound, no guarding. Skin: no rash or induration seen on limited exam Musculoskeletal: grossly normal tone BUE/BLE Psychiatric: grossly normal mood and affect, speech fluent and appropriate Neurologic: Alert and oriented 3. Cranial 2 through 12 grossly intact. No focal deficits. grossly non-focal.          Labs on Admission:  Basic Metabolic Panel:  Recent Labs Lab 06/09/15 1107  NA 136  K 4.2  CL 102  CO2 26  GLUCOSE 100*  BUN 10  CREATININE 0.58  CALCIUM 9.1   Liver Function Tests:  Recent Labs Lab 06/09/15 1107  AST 23  ALT 29  ALKPHOS 77  BILITOT 0.7  PROT 7.1  ALBUMIN 4.5   No results for input(s): LIPASE, AMYLASE in the last 168 hours. No results for input(s): AMMONIA in the last 168 hours. CBC:  Recent Labs Lab 06/09/15 1107  WBC 6.5  HGB 11.8*  HCT 34.8*  MCV 90.6  PLT 195   Cardiac Enzymes: No results for input(s): CKTOTAL, CKMB, CKMBINDEX, TROPONINI in the last 168 hours.  BNP (last 3 results) No results for input(s): BNP in the last 8760 hours.  ProBNP (last 3 results) No results for input(s): PROBNP in the last 8760 hours.  CBG: No results for input(s): GLUCAP in the last 168 hours.  Radiological Exams on Admission: No results found.  EKG: None  Assessment/Plan Principal Problem:   GI bleed Active Problems:   Hypertrophic obstructive cardiomyopathy (HOCM)   Essential  hypertension   Acute blood loss anemia   Asthma  #1 GI bleed Likely a lower GI bleed likely a diverticular bleed. Will admit the patient to telemetry on observation. Will check serial CBCs. Hydrated gently with IV fluids as patient does have a history of hypertropic obstructive cardiomyopathy. Will give a dose of the PPI IV 1. Transfusion threshold hemoglobin less than 8. GI has been consulted and will follow along.  #2 acute blood loss anemia Secondary to problem #1. Follow H&H. Transfusion threshold hemoglobin less than 8.  #3 hypertension Stable. Continue home dose Lopressor.  #4 asthma Stable. Nebs as needed.  #5 hypertropic obstructive cardiomyopathy Stable. Continue beta blocker. Monitor volume status closely.  #6 anxiety Continue Lexapro.  #7 prophylaxis SCDs for DVT prophylaxis.   Code Status: Full DVT Prophylaxis: SCD Family Communication: updated patient. No family present. Disposition Plan: Admit to obervation  Time spent: Cisco MD [facility] Pager [phone]

## 2015-06-09 NOTE — Consult Note (Signed)
[facility] Consult Note  Referring Provider: No ref. provider found Primary Care Physician:  London Pepper, MD Primary Gastroenterologist:  Dr.  Chief Complaint: Rectal bleeding HPI: Monica Marks is an 49 y.o. white female  who presents with set of painless rectal bleeding. She had 5 or 6 passages of bright red blood last night through this morning with none in the last 6 hours. She had a colonoscopy with a descending colon polypectomy 13 days ago. She did not take any blood thinners. She denies any melena or subjective orthostasis.  Past Medical History  Diagnosis Date  . Fibroid   . Hypertension   . Asthma     bring inhaler dos  . Anxiety   . Headache(784.0)   . Glaucoma   . LVH (left ventricular hypertrophy)     TTE showed severe LVH with SAM & a resting LVOT gradient of 49mHg which increased to 440mg with Valsalva   . SOB (shortness of breath) on exertion     During moderate exercise  . HOCM (hypertrophic obstructive cardiomyopathy)     Past Surgical History  Procedure Laterality Date  . Breast surgery  2006    BREAST LIFT  . Abdominoplasty  2006  . Cesarean section      3 TIMES  . Salpingoophorectomy  07/30/2012    Procedure: SALPINGO OOPHORECTOMY;  Surgeon: DaBennetta LaosMD;  [facility];  Service: Gynecology;  Laterality: Right;  . Laparoscopy  07/30/2012    Procedure: LAPAROSCOPY DIAGNOSTIC;  Surgeon: DaBennetta LaosMD;  [facility];  Service: Gynecology;  Laterality: N/A;  attempted  . Abdominal hysterectomy  07/30/2012    Procedure: HYSTERECTOMY ABDOMINAL;  Surgeon: DaBennetta LaosMD;  [facility];  Service: Gynecology;  Laterality: N/A;  . Colonoscopy w/ polypectomy  05/27/2015    With biopsy     (Not in a hospital admission)  Allergies: No Known Allergies  Family History  Problem Relation Age of Onset  . Hypertension Mother   . Cancer Mother   . Lung cancer Father   . Melanoma Father   . Heart  disease Father     HCM, s/p ICD implant  . Stroke Other     Social History:  reports that she quit smoking about 7 years ago. Her smoking use included Cigarettes. She smoked 1.00 pack per day. She does not have any smokeless tobacco history on file. She reports that she drinks alcohol. She reports that she does not use illicit drugs.  Review of Systems: negative except as above  Blood pressure 120/59, pulse 73, temperature 97.7 F (36.5 C), temperature source Oral, resp. rate 11, last menstrual period 07/23/2012, SpO2 96 %. Head: Normocephalic, without obvious abnormality, atraumatic Neck: no adenopathy, no carotid bruit, no JVD, supple, symmetrical, trachea midline and thyroid not enlarged, symmetric, no tenderness/mass/nodules Resp: clear to auscultation bilaterally Cardio: regular rate and rhythm, S1, S2 normal, no murmur, click, rub or gallop GI: Abdomen soft nondistended with normoactive bowel sounds.  Extremities: extremities normal, atraumatic, no cyanosis or edema  Results for orders placed or performed during the hospital encounter of 06/09/15 (from the past 48 hour(s))  ABO/Rh     Status: None   Collection Time: 06/09/15 10:45 AM  Result Value Ref Range   ABO/RH(D) O POS   Comprehensive metabolic panel     Status: Abnormal   Collection Time: 06/09/15 11:07 AM  Result Value Ref Range   Sodium 136 135 - 145 mmol/L   Potassium  4.2 3.5 - 5.1 mmol/L   Chloride 102 101 - 111 mmol/L   CO2 26 22 - 32 mmol/L   Glucose, Bld 100 (H) 65 - 99 mg/dL   BUN 10 6 - 20 mg/dL   Creatinine, Ser 0.58 0.44 - 1.00 mg/dL   Calcium 9.1 8.9 - 10.3 mg/dL   Total Protein 7.1 6.5 - 8.1 g/dL   Albumin 4.5 3.5 - 5.0 g/dL   AST 23 15 - 41 U/L   ALT 29 14 - 54 U/L   Alkaline Phosphatase 77 38 - 126 U/L   Total Bilirubin 0.7 0.3 - 1.2 mg/dL   GFR calc non Af Amer >60 >60 mL/min   GFR calc Af Amer >60 >60 mL/min    Comment: (NOTE) The eGFR has been calculated using the CKD EPI equation. This  calculation has not been validated in all clinical situations. eGFR's persistently <60 mL/min signify possible Chronic Kidney Disease.    Anion gap 8 5 - 15  CBC     Status: Abnormal   Collection Time: 06/09/15 11:07 AM  Result Value Ref Range   WBC 6.5 4.0 - 10.5 K/uL   RBC 3.84 (L) 3.87 - 5.11 MIL/uL   Hemoglobin 11.8 (L) 12.0 - 15.0 g/dL   HCT 34.8 (L) 36.0 - 46.0 %   MCV 90.6 78.0 - 100.0 fL   MCH 30.7 26.0 - 34.0 pg   MCHC 33.9 30.0 - 36.0 g/dL   RDW 12.8 11.5 - 15.5 %   Platelets 195 150 - 400 K/uL  Type and screen     Status: None   Collection Time: 06/09/15 11:07 AM  Result Value Ref Range   ABO/RH(D) O POS    Antibody Screen NEG    Sample Expiration 06/12/2015   POC occult blood, ED     Status: Abnormal   Collection Time: 06/09/15 11:14 AM  Result Value Ref Range   Fecal Occult Bld POSITIVE (A) NEGATIVE   No results found.  Assessment: Very likely post polypectomy bleed from the descending colon.  Plan:  Monitor stools and hemoglobin and if still passing significant blood will attempt flexible sigmoidoscopy tomorrow  HAYES,JOHN C 06/09/2015, 1:55 PM  Pager [phone] If no answer or after 5 PM call [phone]

## 2015-06-09 NOTE — ED Notes (Signed)
Spoke with Janett Billow, Agricultural consultant. Patient can be transported @ 1340.

## 2015-06-09 NOTE — ED Provider Notes (Signed)
CSN: [id number]     Arrival date & time 06/09/15  9924 History   First MD Initiated Contact with Patient 06/09/15 1041     Chief Complaint  Patient presents with  . GI Bleeding     The history is provided by the patient. No language interpreter was used.   Monica Marks presents for evaluation of rectal bleeding. She developed a large amount of dark red blood per rectum with associated clots around 4 PM last night. She's had about 6 bowel movements since that time. She develops lower abdominal cramping prior to bowel movements. She denies any fevers, chest pain, vomiting. She takes no blood thinners. She sees Dr. Amedeo Plenty with GI and had a colonoscopy performed 2 weeks ago that demonstrated an area of irritation as well as a polyp that was removed. No prior similar symptoms. She does not take any aspirin. She has a history of cardiomyopathy with an EF of approximately 23%.No prior endoscopy.  Past Medical History  Diagnosis Date  . Fibroid   . Hypertension   . Asthma     bring inhaler dos  . Anxiety   . Headache(784.0)   . Glaucoma   . LVH (left ventricular hypertrophy)     TTE showed severe LVH with SAM & a resting LVOT gradient of 94mmHg which increased to 35mmHg with Valsalva   . SOB (shortness of breath) on exertion     During moderate exercise  . HOCM (hypertrophic obstructive cardiomyopathy)    Past Surgical History  Procedure Laterality Date  . Breast surgery  2006    BREAST LIFT  . Abdominoplasty  2006  . Cesarean section      3 TIMES  . Salpingoophorectomy  07/30/2012    Procedure: SALPINGO OOPHORECTOMY;  Surgeon: Bennetta Laos, MD;  Location: [facility];  Service: Gynecology;  Laterality: Right;  . Laparoscopy  07/30/2012    Procedure: LAPAROSCOPY DIAGNOSTIC;  Surgeon: Bennetta Laos, MD;  Location: [facility];  Service: Gynecology;  Laterality: N/A;  attempted  . Abdominal hysterectomy  07/30/2012    Procedure: HYSTERECTOMY ABDOMINAL;  Surgeon: Bennetta Laos, MD;   Location: [facility];  Service: Gynecology;  Laterality: N/A;  . Colonoscopy w/ polypectomy  05/27/2015    With biopsy   Family History  Problem Relation Age of Onset  . Hypertension Mother   . Cancer Mother   . Lung cancer Father   . Melanoma Father   . Heart disease Father     HCM, s/p ICD implant  . Stroke Other    Social History  Substance Use Topics  . Smoking status: Former Smoker -- 1.00 packs/day    Types: Cigarettes    Quit date: 05/25/2008  . Smokeless tobacco: None  . Alcohol Use: Yes     Comment: occasional   OB History    Gravida Para Term Preterm AB TAB SAB Ectopic Multiple Living   5 3 3       3      Review of Systems  All other systems reviewed and are negative.     Allergies  Review of patient's allergies indicates no known allergies.  Home Medications   Prior to Admission medications   Medication Sig Start Date End Date Taking? Authorizing Provider  escitalopram (LEXAPRO) 20 MG tablet Take 20 mg by mouth daily.   Yes Historical Provider, MD  ibuprofen (ADVIL,MOTRIN) 200 MG tablet Take 600 mg by mouth every 6 (six) hours as needed for moderate pain.   Yes Historical  Provider, MD  latanoprost (XALATAN) 0.005 % ophthalmic solution Place 1 drop into both eyes at bedtime.   Yes Historical Provider, MD  metoprolol (LOPRESSOR) 50 MG tablet Take 1.5 tablets (75 mg total) by mouth 2 (two) times daily. 07/31/14  Yes Evans Lance, MD  Multiple Vitamins-Minerals (WOMENS MULTI VITAMIN & MINERAL PO) Take 1 tablet by mouth daily.   Yes Historical Provider, MD  VENTOLIN HFA 108 (90 BASE) MCG/ACT inhaler INHALE 1 TO 2 PUFFS EVERY 4 TO 6 HOURS IF NEEDED FOR WHEEZING 04/23/15  Yes Historical Provider, MD  cephALEXin (KEFLEX) 500 MG capsule Take 1 capsule (500 mg total) by mouth 2 (two) times daily. Patient not taking: Reported on 06/09/2015 12/24/14   Carman Ching, PA-C  levalbuterol [facility] HFA) 45 MCG/ACT inhaler Inhale 2 puffs into the lungs 2 (two) times daily at 10 AM  and 5 PM. Patient not taking: Reported on 06/09/2015 08/12/14   Evans Lance, MD  metoprolol (LOPRESSOR) 50 MG tablet TAKE ONE AND ONE-HALF TABLETS BY MOUTH TWICE DAILY  Patient not taking: Reported on 06/09/2015 11/25/14   Evans Lance, MD   BP 138/69 mmHg  Pulse 77  Temp(Src) 97.7 F (36.5 C) (Oral)  Resp 18  SpO2 100%  LMP 07/23/2012 Physical Exam  Constitutional: She is oriented to person, place, and time. She appears well-developed and well-nourished.  HENT:  Head: Normocephalic and atraumatic.  Cardiovascular: Normal rate and regular rhythm.   SEM  Pulmonary/Chest: Effort normal and breath sounds normal. No respiratory distress.  Abdominal: Soft. There is no tenderness. There is no rebound and no guarding.  Genitourinary:  External hemorrhoids.  Small amount of dark red blood in rectal vault.   Musculoskeletal: She exhibits no edema or tenderness.  Neurological: She is alert and oriented to person, place, and time.  Skin: Skin is warm and dry.  Psychiatric: She has a normal mood and affect. Her behavior is normal.  Nursing note and vitals reviewed.   ED Course  Procedures (including critical care time) Labs Review Labs Reviewed  COMPREHENSIVE METABOLIC PANEL - Abnormal; Notable for the following:    Glucose, Bld 100 (*)    All other components within normal limits  CBC - Abnormal; Notable for the following:    RBC 3.84 (*)    Hemoglobin 11.8 (*)    HCT 34.8 (*)    All other components within normal limits  CBC - Abnormal; Notable for the following:    RBC 3.80 (*)    Hemoglobin 11.9 (*)    HCT 34.4 (*)    All other components within normal limits  POC OCCULT BLOOD, ED - Abnormal; Notable for the following:    Fecal Occult Bld POSITIVE (*)    All other components within normal limits  URINE CULTURE  PROTIME-INR  CBC  URINALYSIS, ROUTINE W REFLEX MICROSCOPIC (NOT AT [facility])  TYPE AND SCREEN  ABO/RH    Imaging Review No results found. I have personally  reviewed and evaluated these images and lab results as part of my medical decision-making.   EKG Interpretation None      MDM   Final diagnoses:  Acute GI bleeding    Patient here for evaluation of rectal bleeding, she does have bleeding present on examination. She was noted to be anemic with hemoglobin 11.8, the last available hemoglobin is from 3 years ago and it was 15 at that time. She does appear hemodynamically stable in the emergency department. Plan to admit to medicine  with consult by [facility], Dr. Amedeo Plenty.     Quintella Reichert, MD 06/09/15 [phone]

## 2015-06-09 NOTE — Progress Notes (Signed)
OT Cancellation Note  Patient Details Name: Monica Marks MRN: [id number] DOB: 01/18/1966   Cancelled Treatment:    Reason Eval/Treat Not Completed: OT screened, no needs identified, will sign off. Spoke with pt and she has no OT concerns. Benito Mccreedy OTR/L [phone] 06/09/2015, 4:53 PM

## 2015-06-09 NOTE — ED Notes (Signed)
MD at bedside. 

## 2015-06-09 NOTE — ED Notes (Signed)
49 yo with Colonoscopy on the 9/14 has started passing dark blood clots diarrhea like stool all beginning yesterday at 1600. Reports cramping and discomfort with mild dizziness. Denies N/V or have taken any medication. Has contacted PCP and they told her to come to the ED.

## 2015-06-10 DIAGNOSIS — D62 Acute posthemorrhagic anemia: Secondary | ICD-10-CM | POA: Diagnosis not present

## 2015-06-10 DIAGNOSIS — I10 Essential (primary) hypertension: Secondary | ICD-10-CM | POA: Diagnosis not present

## 2015-06-10 LAB — BASIC METABOLIC PANEL
Anion gap: 6 (ref 5–15)
BUN: 8 mg/dL (ref 6–20)
CHLORIDE: 106 mmol/L (ref 101–111)
CO2: 27 mmol/L (ref 22–32)
CREATININE: 0.65 mg/dL (ref 0.44–1.00)
Calcium: 8.7 mg/dL — ABNORMAL LOW (ref 8.9–10.3)
GFR calc non Af Amer: >60 mL/min (ref >60)
GLUCOSE: 98 mg/dL (ref 65–99)
Potassium: 4.4 mmol/L (ref 3.5–5.1)
Sodium: 139 mmol/L (ref 135–145)

## 2015-06-10 LAB — URINE CULTURE

## 2015-06-10 LAB — CBC
HEMATOCRIT: 32.7 % — AB (ref 36.0–46.0)
HEMOGLOBIN: 10.9 g/dL — AB (ref 12.0–15.0)
MCH: 30.5 pg (ref 26.0–34.0)
MCHC: 33.3 g/dL (ref 30.0–36.0)
MCV: 91.6 fL (ref 78.0–100.0)
Platelets: 172 10*3/uL (ref 150–400)
RBC: 3.57 MIL/uL — ABNORMAL LOW (ref 3.87–5.11)
RDW: 12.9 % (ref 11.5–15.5)
WBC: 8.3 10*3/uL (ref 4.0–10.5)

## 2015-06-10 LAB — PROTIME-INR
INR: 1.06 (ref 0.00–1.49)
Prothrombin Time: 14 seconds (ref 11.6–15.2)

## 2015-06-10 MED ORDER — PANTOPRAZOLE SODIUM 40 MG PO TBEC: 40.0000 mg | DELAYED_RELEASE_TABLET | Freq: Every day | ORAL | Status: DC

## 2015-06-10 MED ORDER — IPRATROPIUM BROMIDE 0.02 % IN SOLN: 0.5000 mg | RESPIRATORY_TRACT | Status: DC | PRN

## 2015-06-10 NOTE — Progress Notes (Signed)
Reviewed discharge information with patient and caregiver. Answered all questions. Patient/caregiver able to teach back medications (pantoprozale) and reasons to contact MD/911. Patient verbalizes importance of keeping PCP follow up appointment.  Monica Marks. Brigitte Pulse, RN

## 2015-06-10 NOTE — Progress Notes (Signed)
[facility] Progress Note  Subjective: The patient feels fine, had one solid bowel movement with slight blood tinge  Objective: Vital signs in last 24 hours: Temp:  [97.9 F (36.6 C)-98.2 F (36.8 C)] 98.2 F (36.8 C) (09/28 0952) Pulse Rate:  [65-82] 65 (09/28 0952) Resp:  [11-20] 16 (09/28 0952) BP: (112-127)/(52-73) 119/59 mmHg (09/28 0952) SpO2:  [95 %-98 %] 97 % (09/28 0952) Weight:  [97.433 kg (214 lb 12.8 oz)-99.1 kg (218 lb 7.6 oz)] 97.433 kg (214 lb 12.8 oz) (09/28 0631) Weight change:    PE: Unchanged  Lab Results: Results for orders placed or performed during the hospital encounter of 06/09/15 (from the past 24 hour(s))  POC occult blood, ED     Status: Abnormal   Collection Time: 06/09/15 11:14 AM  Result Value Ref Range   Fecal Occult Bld POSITIVE (A) NEGATIVE  CBC     Status: Abnormal   Collection Time: 06/09/15  1:49 PM  Result Value Ref Range   WBC 7.2 4.0 - 10.5 K/uL   RBC 3.80 (L) 3.87 - 5.11 MIL/uL   Hemoglobin 11.9 (L) 12.0 - 15.0 g/dL   HCT 34.4 (L) 36.0 - 46.0 %   MCV 90.5 78.0 - 100.0 fL   MCH 31.3 26.0 - 34.0 pg   MCHC 34.6 30.0 - 36.0 g/dL   RDW 12.8 11.5 - 15.5 %   Platelets 199 150 - 400 K/uL  Protime-INR     Status: None   Collection Time: 06/09/15  3:40 PM  Result Value Ref Range   Prothrombin Time 14.1 11.6 - 15.2 seconds   INR 1.07 0.00 - 1.49  Urine culture     Status: None (Preliminary result)   Collection Time: 06/09/15  7:29 PM  Result Value Ref Range   Specimen Description URINE, CLEAN CATCH    Special Requests NONE    Culture      NO GROWTH < 12 HOURS Performed at [facility]    Report Status PENDING   Urinalysis, Routine w reflex microscopic (not at [facility])     Status: Abnormal   Collection Time: 06/09/15  7:30 PM  Result Value Ref Range   Color, Urine YELLOW YELLOW   APPearance CLEAR CLEAR   Specific Gravity, Urine 1.007 1.005 - 1.030   pH 7.5 5.0 - 8.0   Glucose, UA NEGATIVE NEGATIVE mg/dL   Hgb  urine dipstick NEGATIVE NEGATIVE   Bilirubin Urine NEGATIVE NEGATIVE   Ketones, ur 15 (A) NEGATIVE mg/dL   Protein, ur NEGATIVE NEGATIVE mg/dL   Urobilinogen, UA 0.2 0.0 - 1.0 mg/dL   Nitrite NEGATIVE NEGATIVE   Leukocytes, UA NEGATIVE NEGATIVE  CBC     Status: Abnormal   Collection Time: 06/09/15  8:57 PM  Result Value Ref Range   WBC 8.3 4.0 - 10.5 K/uL   RBC 3.64 (L) 3.87 - 5.11 MIL/uL   Hemoglobin 11.1 (L) 12.0 - 15.0 g/dL   HCT 33.0 (L) 36.0 - 46.0 %   MCV 90.7 78.0 - 100.0 fL   MCH 30.5 26.0 - 34.0 pg   MCHC 33.6 30.0 - 36.0 g/dL   RDW 12.9 11.5 - 15.5 %   Platelets 174 150 - 400 K/uL  Basic metabolic panel     Status: Abnormal   Collection Time: 06/10/15  4:54 AM  Result Value Ref Range   Sodium 139 135 - 145 mmol/L   Potassium 4.4 3.5 - 5.1 mmol/L   Chloride 106 101 - 111 mmol/L  CO2 27 22 - 32 mmol/L   Glucose, Bld 98 65 - 99 mg/dL   BUN 8 6 - 20 mg/dL   Creatinine, Ser 0.65 0.44 - 1.00 mg/dL   Calcium 8.7 (L) 8.9 - 10.3 mg/dL   GFR calc non Af Amer >60 >60 mL/min   GFR calc Af Amer >60 >60 mL/min   Anion gap 6 5 - 15  Protime-INR     Status: None   Collection Time: 06/10/15  4:54 AM  Result Value Ref Range   Prothrombin Time 14.0 11.6 - 15.2 seconds   INR 1.06 0.00 - 1.49  CBC     Status: Abnormal   Collection Time: 06/10/15  4:54 AM  Result Value Ref Range   WBC 8.3 4.0 - 10.5 K/uL   RBC 3.57 (L) 3.87 - 5.11 MIL/uL   Hemoglobin 10.9 (L) 12.0 - 15.0 g/dL   HCT 32.7 (L) 36.0 - 46.0 %   MCV 91.6 78.0 - 100.0 fL   MCH 30.5 26.0 - 34.0 pg   MCHC 33.3 30.0 - 36.0 g/dL   RDW 12.9 11.5 - 15.5 %   Platelets 172 150 - 400 K/uL    Studies/Results: No results found.    Assessment: Post polypectomy bleed, self-limiting appears to have stopped.   Plan: Discharge today avoid aspirin and NSAIDs for 2 weeks. Call when necessary for recurrence of bleeding.    HAYES,JOHN C 06/10/2015, 11:11 AM  Pager [phone] If no answer or after 5 PM call  [phone]

## 2015-06-10 NOTE — Discharge Summary (Signed)
Physician Discharge Summary  Monica Marks [id number] DOB: August 22, 1966 DOA: 06/09/2015  PCP: London Pepper, MD  Admit date: 06/09/2015 Discharge date: 06/10/2015  Time spent: 35 minutes  Recommendations for Outpatient Follow-up:  Needs CBC to follow hb.   Discharge Diagnoses:    GI bleed, secondary to polypectomy    Hypertrophic obstructive cardiomyopathy (HOCM)   Essential hypertension   Acute blood loss anemia   Asthma   Discharge Condition: Stable.   Diet recommendation: Regular diet   Filed Weights   06/09/15 1437 06/10/15 0631  Weight: 99.1 kg (218 lb 7.6 oz) 97.433 kg (214 lb 12.8 oz)    History of present illness:  Monica Marks is a 49 y.o. female  With history of hypertension, hypertrophic obstructive cardiomyopathy, Fibroids, asthma, anxiety, who presents to the ED with maroon colored stools which started at 4 PM today prior to admission. Patient stated had 6 bloody bowel movements the day prior to admission. On the morning of admission patient had 2 more maroon colored stools with some associated abdominal cramping some lightheadedness and subsequently presented to the ED. Patient does endorse some use of IV pro-last use was 2 days prior to admission when she took 600 mg. Patient denies any fevers, no chills, no chest pain, no shortness of breath, no nausea, no vomiting, no dysuria, no weakness, no hematemesis, no melanotic, no cough, no history of peptic ulcer disease. Patient stated on 05/27/2015 had a polypectomy without any complications per her gastroenterologist. Patient was seen in the ED CBC obtained had a hemoglobin of 11.8 otherwise was within normal limits. Comprehensive metabolic profile is unremarkable. FOBT was positive. The ED physician blood was noted in the rectal vault. [facility] call to admit the patient for further evaluation and management.  Hospital Course:  1 GI bleed Likely a lower GI bleed secondary to recent  polypectomy.    Hb has remain stable.  No further bleed. Advised patient not to take ibuprofen.  Discharge on daily protonix.   2 acute blood loss anemia Secondary to problem #1 Hb stable.   3 hypertension Stable. Continue home dose Lopressor.  4 asthma Stable. Nebs as needed.  5 hypertropic obstructive cardiomyopathy Stable. Continue beta blocker. Monitor volume status closely.  6 anxiety Continue Lexapro.  7 prophylaxis SCDs for DVT prophylaxis.  Procedures:  none  Consultations:  GI  Discharge Exam: Filed Vitals:   06/10/15 0952  BP: 119/59  Pulse: 65  Temp: 98.2 F (36.8 C)  Resp: 16    General: NAD Cardiovascular: S 1, S 2 RRR Respiratory: CTA  Discharge Instructions   Discharge Instructions    Diet - low sodium heart healthy    Complete by:  As directed      Increase activity slowly    Complete by:  As directed           Current Discharge Medication List    START taking these medications   Details  ipratropium (ATROVENT) 0.02 % nebulizer solution Take 2.5 mLs (0.5 mg total) by nebulization every 2 (two) hours as needed for wheezing or shortness of breath. Qty: 75 mL, Refills: 12    pantoprazole (PROTONIX) 40 MG tablet Take 1 tablet (40 mg total) by mouth daily. Qty: 15 tablet, Refills: 0      CONTINUE these medications which have NOT CHANGED   Details  escitalopram (LEXAPRO) 20 MG tablet Take 20 mg by mouth daily.    latanoprost (XALATAN) 0.005 % ophthalmic solution Place 1 drop into both  eyes at bedtime.    metoprolol (LOPRESSOR) 50 MG tablet Take 1.5 tablets (75 mg total) by mouth 2 (two) times daily. Qty: 90 tablet, Refills: 1    Multiple Vitamins-Minerals (WOMENS MULTI VITAMIN & MINERAL PO) Take 1 tablet by mouth daily.    VENTOLIN HFA 108 (90 BASE) MCG/ACT inhaler INHALE 1 TO 2 PUFFS EVERY 4 TO 6 HOURS IF NEEDED FOR WHEEZING Refills: 4    levalbuterol (XOPENEX HFA) 45 MCG/ACT inhaler Inhale 2 puffs into the lungs 2 (two)  times daily at 10 AM and 5 PM. Qty: 1 Inhaler, Refills: 12   Associated Diagnoses: Dyspnea      STOP taking these medications     ibuprofen (ADVIL,MOTRIN) 200 MG tablet      cephALEXin (KEFLEX) 500 MG capsule        No Known Allergies Follow-up Information    Follow up with London Pepper, MD In 1 week.   Specialty:  Family Medicine   Contact information:   [address] [place]  [id number] [phone]        The results of significant diagnostics from this hospitalization (including imaging, microbiology, ancillary and laboratory) are listed below for reference.    Significant Diagnostic Studies: No results found.  Microbiology: Recent Results (from the past 240 hour(s))  Urine culture     Status: None (Preliminary result)   Collection Time: 06/09/15  7:29 PM  Result Value Ref Range Status   Specimen Description URINE, CLEAN CATCH  Final   Special Requests NONE  Final   Culture   Final    NO GROWTH < 12 HOURS Performed at [facility]    Report Status PENDING  Incomplete     Labs: Basic Metabolic Panel:  Recent Labs Lab 06/09/15 1107 06/10/15 0454  NA 136 139  K 4.2 4.4  CL 102 106  CO2 26 27  GLUCOSE 100* 98  BUN 10 8  CREATININE 0.58 0.65  CALCIUM 9.1 8.7*   Liver Function Tests:  Recent Labs Lab 06/09/15 1107  AST 23  ALT 29  ALKPHOS 77  BILITOT 0.7  PROT 7.1  ALBUMIN 4.5   No results for input(s): LIPASE, AMYLASE in the last 168 hours. No results for input(s): AMMONIA in the last 168 hours. CBC:  Recent Labs Lab 06/09/15 1107 06/09/15 1349 06/09/15 2057 06/10/15 0454  WBC 6.5 7.2 8.3 8.3  HGB 11.8* 11.9* 11.1* 10.9*  HCT 34.8* 34.4* 33.0* 32.7*  MCV 90.6 90.5 90.7 91.6  PLT 195 199 174 172   Cardiac Enzymes: No results for input(s): CKTOTAL, CKMB, CKMBINDEX, TROPONINI in the last 168 hours. BNP: BNP (last 3 results) No results for input(s): BNP in the last 8760 hours.  ProBNP (last 3  results) No results for input(s): PROBNP in the last 8760 hours.  CBG: No results for input(s): GLUCAP in the last 168 hours.     SignedNiel Hummer A  [facility] 06/10/2015, 11:20 AM

## 2015-06-10 NOTE — Progress Notes (Signed)
PT Cancellation Note / Screen  Patient Details Name: Monica Marks MRN: [id number] DOB: Mar 02, 1966   Cancelled Treatment:    Reason Eval/Treat Not Completed: PT screened, no needs identified, will sign off Pt reports no PT needs at this time.   LEMYRE,KATHrine E 06/10/2015, 11:58 AM Carmelia Bake, PT, DPT 06/10/2015 Pager: [phone]

## 2015-06-25 ENCOUNTER — Telehealth: Payer: Self-pay | Admitting: Internal Medicine

## 2015-06-25 NOTE — Telephone Encounter (Signed)
Pt c/o BP issue: STAT if pt c/o blurred vision, one-sided weakness or slurred speech  1. What are your last 5 BP readings? Laying to standing-up 90/65  2. Are you having any other symptoms (ex. Dizziness, headache, blurred vision, passed out)? Lightheaded, pre-syncope, left ear deafness  3. What is your BP issue? BP too low

## 2015-06-25 NOTE — Telephone Encounter (Signed)
Spoke with patient and she says she had a bleed after a routine colonoscopy a few weeks ago and since then her BP has been low and she has felt dizzy.  She is going to increase her fluids and see if this helps, but if it does not she will decrease her Metoprolol to 50 mg twice daily.  Made her a follow up appointment as she is due in December.  She will let me know if these things do not help and we will have her come in sooner than currently scheduled

## 2015-07-31 ENCOUNTER — Ambulatory Visit: Payer: BC Managed Care – PPO | Admitting: Internal Medicine

## 2015-07-31 DIAGNOSIS — R0989 Other specified symptoms and signs involving the circulatory and respiratory systems: Secondary | ICD-10-CM

## 2015-08-03 ENCOUNTER — Encounter: Payer: Self-pay | Admitting: Internal Medicine

## 2015-08-11 ENCOUNTER — Other Ambulatory Visit: Payer: Self-pay | Admitting: Internal Medicine

## 2015-09-11 ENCOUNTER — Other Ambulatory Visit: Payer: Self-pay | Admitting: Internal Medicine

## 2015-10-28 ENCOUNTER — Other Ambulatory Visit: Payer: Self-pay | Admitting: Internal Medicine

## 2015-10-30 ENCOUNTER — Encounter: Payer: Self-pay | Admitting: Internal Medicine

## 2015-10-30 ENCOUNTER — Ambulatory Visit (INDEPENDENT_AMBULATORY_CARE_PROVIDER_SITE_OTHER): Payer: BC Managed Care – PPO | Admitting: Internal Medicine

## 2015-10-30 VITALS — BP 156/100 | HR 60 | Ht 65.5 in | Wt 217.4 lb

## 2015-10-30 DIAGNOSIS — I421 Obstructive hypertrophic cardiomyopathy: Secondary | ICD-10-CM | POA: Diagnosis not present

## 2015-10-30 DIAGNOSIS — R002 Palpitations: Secondary | ICD-10-CM | POA: Diagnosis not present

## 2015-10-30 NOTE — Patient Instructions (Addendum)
Medication Instructions:  Your physician recommends that you continue on your current medications as directed. Please refer to the Current Medication list given to you today.  Labwork: None  ordered  Testing/Procedures: None  ordered  Follow-Up: Your physician wants you to follow-up in: 2 years with Dr. Lovena Le.  You will receive a reminder letter in the mail two months in advance. If you don't receive a letter, please call our office to schedule the follow-up appointment.   Any Other Special Instructions Will Be Listed Below (If Applicable).  If you need a refill on your cardiac medications before your next appointment, please call your pharmacy.  Thank you for choosing [facility]!!

## 2015-10-30 NOTE — Progress Notes (Signed)
HPI Monica Marks returns today for followup. She is a pleasant 50 yo woman with obesity, HTN, and HOCM. She has not had syncope. Subsequent evaluation has demonstrated no sustained or non-sustained VT by holter monitoring, no hypotensive response to exercise on exercise treadmill testing, and good tolerance to taking beta blocker therapy. She has not had dyspnea with exertion  She has been trying to exercise. She has not had peripheral edema. No PND or orthopnea.  She denies cough.  No Known Allergies   Current Outpatient Prescriptions  Medication Sig Dispense Refill  . escitalopram (LEXAPRO) 20 MG tablet Take 20 mg by mouth daily.    Marland Kitchen latanoprost (XALATAN) 0.005 % ophthalmic solution Place 1 drop into both eyes at bedtime.    . metoprolol (LOPRESSOR) 50 MG tablet Take one and 1/2 tablets by mouth twice daily 45 tablet 11  . Multiple Vitamins-Minerals (WOMENS MULTI VITAMIN & MINERAL PO) Take 1 tablet by mouth daily.    . VENTOLIN HFA 108 (90 BASE) MCG/ACT inhaler INHALE 1 TO 2 PUFFS EVERY 4 TO 6 HOURS IF NEEDED FOR WHEEZING  4   No current facility-administered medications for this visit.     Past Medical History  Diagnosis Date  . Fibroid   . Hypertension   . Asthma     bring inhaler dos  . Anxiety   . Headache(784.0)   . Glaucoma   . LVH (left ventricular hypertrophy)     TTE showed severe LVH with SAM & a resting LVOT gradient of 40mmHg which increased to 52mmHg with Valsalva   . SOB (shortness of breath) on exertion     During moderate exercise  . HOCM (hypertrophic obstructive cardiomyopathy) ([place])     ROS:   All systems reviewed and negative except as noted in the HPI.   Past Surgical History  Procedure Laterality Date  . Breast surgery  2006    BREAST LIFT  . Abdominoplasty  2006  . Cesarean section      3 TIMES  . Salpingoophorectomy  07/30/2012    Procedure: SALPINGO OOPHORECTOMY;  Surgeon: Bennetta Laos, MD;  Location: [facility];  Service:  Gynecology;  Laterality: Right;  . Laparoscopy  07/30/2012    Procedure: LAPAROSCOPY DIAGNOSTIC;  Surgeon: Bennetta Laos, MD;  Location: [facility];  Service: Gynecology;  Laterality: N/A;  attempted  . Abdominal hysterectomy  07/30/2012    Procedure: HYSTERECTOMY ABDOMINAL;  Surgeon: Bennetta Laos, MD;  Location: [facility];  Service: Gynecology;  Laterality: N/A;  . Colonoscopy w/ polypectomy  05/27/2015    With biopsy     Family History  Problem Relation Age of Onset  . Hypertension Mother   . Cancer Mother   . Lung cancer Father   . Melanoma Father   . Heart disease Father     HCM, s/p ICD implant  . Stroke Other      Social History   Social History  . Marital Status: Married    Spouse Name: N/A  . Number of Children: N/A  . Years of Education: N/A   Occupational History  . Not on file.   Social History Main Topics  . Smoking status: Former Smoker -- 1.00 packs/day    Types: Cigarettes    Quit date: 05/25/2008  . Smokeless tobacco: Not on file  . Alcohol Use: Yes     Comment: occasional  . Drug Use: No  . Sexual Activity: Yes    Birth Control/ Protection: Surgical  Other Topics Concern  . Not on file   Social History Narrative     BP 156/100 mmHg  Pulse 60  Ht 5' 5.5" (1.664 m)  Wt 217 lb 6.4 oz (98.612 kg)  BMI 35.61 kg/m2  LMP 07/23/2012  Physical Exam:  Well appearing middle aged woman, NAD HEENT: Unremarkable Neck:  No JVD, no thyromegally Back:  No CVA tenderness Lungs:  Clear with no wheezes HEART:  Regular rate rhythm, grade 2/6 systolic murmur, no rubs, no clicks Abd:  soft, positive bowel sounds, no organomegally, no rebound, no guarding Ext:  2 plus pulses, no edema, no cyanosis, no clubbing Skin:  No rashes no nodules Neuro:  CN II through XII intact, motor grossly intact  ECG - nsr with LVH and inferior STT abnormality, unchanged.  Assess/Plan: 1. HOCM - she remains stable and is asymptomatic. I have encouraged modest  exercise 2. HTN - her blood pressure is high today but at home nearly normal. I will continue her current meds and have asked her to reduce her salt and to lose weight. I considered switching her beta blocker or adding verapamil but her HR is on low side. 3. Obesity - she is encouraged to lose weight. 4. Asthma - she is tolerating her beta 1 selective beta blocker and uses bronchodilators as needed. Mikle Bosworth.D.

## 2016-06-03 ENCOUNTER — Other Ambulatory Visit (HOSPITAL_COMMUNITY): Payer: Self-pay | Admitting: General Surgery

## 2016-06-14 ENCOUNTER — Ambulatory Visit (HOSPITAL_COMMUNITY)
Admission: RE | Admit: 2016-06-14 | Discharge: 2016-06-14 | Disposition: A | Discharge status: Home / Self Care | Payer: BC Managed Care – PPO | Source: Ambulatory Visit | Attending: General Surgery | Admitting: General Surgery

## 2016-06-14 ENCOUNTER — Other Ambulatory Visit: Payer: Self-pay

## 2016-06-14 DIAGNOSIS — K449 Diaphragmatic hernia without obstruction or gangrene: Secondary | ICD-10-CM | POA: Diagnosis not present

## 2016-06-14 DIAGNOSIS — M47892 Other spondylosis, cervical region: Secondary | ICD-10-CM | POA: Insufficient documentation

## 2016-06-27 ENCOUNTER — Encounter: Payer: BC Managed Care – PPO | Attending: General Surgery | Admitting: Dietician

## 2016-06-27 DIAGNOSIS — Z713 Dietary counseling and surveillance: Secondary | ICD-10-CM | POA: Insufficient documentation

## 2016-06-27 DIAGNOSIS — E669 Obesity, unspecified: Secondary | ICD-10-CM

## 2016-06-27 NOTE — Patient Instructions (Signed)
Follow Pre-Op Goals Try Protein Shakes Call [facility] at [phone] when surgery is scheduled to enroll in Pre-Op Class  Things to remember:  Please always be honest with us. We want to support you!  If you have any questions or concerns in between appointments, please call or email Liz, Leslie, or Laurie.  The diet after surgery will be high protein and low in carbohydrate.  Vitamins and calcium need to be taken for the rest of your life.  Feel free to include support people in any classes or appointments.   Supplement recommendations:  Before Surgery   1 Complete Multivitamin with Iron  3000 IU Vitamin D3  After Surgery   2 Chewable Multivitamins  **Best Choice - Bariatric Advantage Advanced Multi EA      3 Chewable Calcium (500 mg each, total 1200-1500 mg per day)  **Best Choice - Celebrate, Bariatric Advantage, or Wellesse  Other Options:    2 Flinstones Complete + up to 100 mg Thiamin + 2000-3000 IU Vitamin D3 + 350-500 mcg Vitamin B12 + 30-45 mg Iron (with history of deficiency)  2 Celebrate MultiComplete with 18 mg Iron (this provides 6000 IU of  Vitamin D3)  4 Celebrate Essential Multi 2 in 1 (has calcium) + 18-60 mg separate  iron  Vitamins and Calcium are available at:   [facility]   [address], [place], [place] [address]   [email or web address]  [email or web address]  [email or web address]   

## 2016-06-27 NOTE — Progress Notes (Signed)
  Pre-Op Assessment Visit:  Pre-Operative Sleeve Gastrectomy Surgery  Medical Nutrition Therapy:  Appt start time: [id number]   End time:  [id number].  Patient was seen on 06/27/2016 for Pre-Operative Nutrition Assessment. Assessment and letter of approval faxed to [facility] Bariatric Surgery Program coordinator on 06/27/2016.   Preferred Learning Style:   No preference indicated   Learning Readiness:   Ready  Handouts given during visit include:  Pre-Op Goals Bariatric Surgery Protein Shakes   During the appointment today the following Pre-Op Goals were reviewed with the patient: Maintain or lose weight as instructed by your surgeon Make healthy food choices Begin to limit portion sizes Limited concentrated sugars and fried foods Keep fat/sugar in the single digits per serving on   food labels Practice CHEWING your food  (aim for 30 chews per bite or until applesauce consistency) Practice not drinking 15 minutes before, during, and 30 minutes after each meal/snack Avoid all carbonated beverages  Avoid/limit caffeinated beverages  Avoid all sugar-sweetened beverages Consume 3 meals per day; eat every 3-5 hours Make a list of non-food related activities Aim for 64-100 ounces of FLUID daily  Aim for at least 60-80 grams of PROTEIN daily Look for a liquid protein source that contain ?15 g protein and ?5 g carbohydrate  (ex: shakes, drinks, shots)  Patient-Centered Goals:  Cardiologist would like her to lose 50 lbs in order to help cardiomyopathy, improve back pain. 10 confidence/10 importance scale 1-10  Demonstrated degree of understanding via:  Teach Back  Teaching Method Utilized:  Visual Auditory Hands on  Barriers to learning/adherence to lifestyle change: none  [facility] to [facility] to enroll in Pre-Op and Post-Op Nutrition Education when surgery date is scheduled.

## 2016-08-01 ENCOUNTER — Encounter: Payer: BC Managed Care – PPO | Attending: General Surgery | Admitting: Dietician

## 2016-08-01 DIAGNOSIS — Z713 Dietary counseling and surveillance: Secondary | ICD-10-CM | POA: Diagnosis not present

## 2016-08-02 NOTE — Progress Notes (Signed)
Supervised Weight Loss Class:  Appt start time: 1600   End time:  1630.  Patient was seen on 08/02/2016 for the "Mindful Eating" Supervised Weight Loss Class at the [facility].   Surgery type: sleeve gastrectomy Start weight at [facility]: 226 lbs Weight today: 231.6 lbs Weight change: 5.6 lbs   The following learning objectives were met by the patient during this class:  Objectives: -Discuss masticating foods to appropriate consistency  -Helps food fit in pouch and prevents pain and vomiting  -Eating slowly and taking tiny bites help brain register fullness -Encourage patients to begin to think about what diet will be like after surgery -Encourage patients to begin to consider portion sizes and listen to hunger/fullness cues -Review importance of regular meals and snacks -Discuss diet advancement after surgery -Review causes and symptoms of dumping syndrome  Goals: -Begin limiting fried and other high fat foods and foods high in sugar -Eat proteins first, then vegetables, then starch -Practice chewing foods to applesauce consistency -Aim to spend 20 minutes eating meals -Work on eating 3 meals a day (or every 3-5 hours) if you are not already doing so  Handouts given: Mindful Eating brochure

## 2016-08-29 ENCOUNTER — Encounter: Payer: BC Managed Care – PPO | Admitting: Dietician

## 2016-08-31 ENCOUNTER — Encounter: Payer: BC Managed Care – PPO | Attending: General Surgery | Admitting: Dietician

## 2016-08-31 DIAGNOSIS — Z713 Dietary counseling and surveillance: Secondary | ICD-10-CM | POA: Diagnosis not present

## 2016-08-31 NOTE — Patient Instructions (Addendum)
Work on not drinking during meals.  Continue to work on smaller bites/chewing well.

## 2016-08-31 NOTE — Progress Notes (Signed)
  Supervised Weight Loss Visit:   Pre-Operative sleeve gastrectomy Surgery  Medical Nutrition Therapy:  Appt start time: [id number] end time:  1610.  Primary concerns today: Supervised Weight Loss Visit. Returns with a 2 lb weight loss. Has been working on chewing, taking vitamins, and drinking water. Not having sweets or fried foods.   Still working on not drinking during meals. Likes Premier Protein shakes best.   Work has been very active.   Surgery type: sleeve gastrectomy Start weight at [facility]: 226 lbs Weight today: 231.6 lbs Weight change: 2 lbs weight loss  Preferred Learning Style:   No preference indicated   Learning Readiness:   Ready   24-hr recall: B (AM): breakfast quesadilla smart one Snk (AM): none  L (PM): grilled chicken and salad Snk (PM): none  D (PM): meat and vegetables Snk (PM): none   Beverages: water, 1/2 sweet tea occassionally   Medications: see list  Recent physical activity:  Work is active   Progress Towards Goal(s):  In progress.  Handouts given during visit include: none Nutritional Diagnosis:  [place]-3.3 Obesity related to past poor dietary habits and physical inactivity as evidenced by patient attending supervised weight loss for insurance approval of bariatric surgery.    Intervention:  Nutrition counseling provided. Plan: Work on not drinking during meals.  Continue to work on smaller bites/chewing well.   Teaching Method Utilized:  Visual Auditory Hands on  Barriers to learning/adherence to lifestyle change: none  Demonstrated degree of understanding via:  Teach Back   Monitoring/Evaluation:  Dietary intake, exercise, and body weight. Follow up in 1 months for supervised weight loss visit.

## 2016-09-19 ENCOUNTER — Encounter: Payer: BC Managed Care – PPO | Attending: General Surgery | Admitting: Dietician

## 2016-09-19 DIAGNOSIS — Z713 Dietary counseling and surveillance: Secondary | ICD-10-CM | POA: Diagnosis present

## 2016-09-20 ENCOUNTER — Encounter: Payer: Self-pay | Admitting: Dietician

## 2016-09-20 NOTE — Progress Notes (Signed)
  Supervised Weight Loss Class:  Appt start time: 1600   End time:  1630.  Patient was seen on 09/19/2016 for the "Protein Shakes" Supervised Weight Loss Class at the [facility].   Surgery type: Sleeve gastrectomy Start weight at [facility]: 229 lbs Weight today: 233.1 lbs Weight change: 4 lbs gain  The following learning objectives were met by the patient during this class:  Objectives: -Review when protein shakes will be recommended in the surgery process -Identify criteria for acceptable protein shakes -Provide examples of acceptable and unacceptable protein shakes -Remind patients that taste preferences may change after surgery  Goals: -Find 1 or more protein shakes that meet the criteria  -Look for a shake that is acceptable for taste and budget  Handouts given: Protein shakes brochure  Sample provided and patient instructed on proper use: Premier protein shake (vanilla - qty 1) Lot#: [id number] Exp: 06/2017

## 2016-10-11 ENCOUNTER — Ambulatory Visit: Payer: Self-pay | Admitting: General Surgery

## 2016-10-12 ENCOUNTER — Encounter: Payer: Self-pay | Admitting: Dietician

## 2016-10-12 ENCOUNTER — Encounter: Payer: BC Managed Care – PPO | Admitting: Dietician

## 2016-10-12 DIAGNOSIS — Z713 Dietary counseling and surveillance: Secondary | ICD-10-CM | POA: Diagnosis not present

## 2016-10-12 NOTE — Progress Notes (Signed)
  Pre-Operative Nutrition Class:  Appt start time: 230   End time:  373  Patient was seen on 10/12/2016 for Pre-Operative Bariatric Surgery Education at [facility].   Surgery date: 10/31/2016 Surgery type: sleeve gastrectomy Start weight at [facility]: 233 lbs on 09/19/2016 Weight today: 229.4 lbs  TANITA  BODY COMP RESULTS  10/12/16   BMI (kg/m^2) 38.8   Fat Mass (lbs) 115.2   Fat Free Mass (lbs) 114.2   Total Body Water (lbs) 83.2   Samples given per MNT protocol. Patient educated on appropriate usage: Bariatric Advantage Calcium Citrate chew (lemon - qty 1) Lot #: [id number] Exp: 02/2017  Bariatric Advantage Vitamins Multivitamin (mixed fruit - qty 1) Lot #: [id number] Exp: 07/2017  Premier Protein shake (strawberry - qty 1) Lot #: [id number] Exp: 07/2017  The following the learning objectives were met by the patient during this course:  Identify Pre-Op Dietary Goals and will begin 2 weeks pre-operatively  Identify appropriate sources of fluids and proteins   State protein recommendations and appropriate sources pre and post-operatively  Identify Post-Operative Dietary Goals and will follow for 2 weeks post-operatively  Identify appropriate multivitamin and calcium sources  Describe the need for physical activity post-operatively and will follow MD recommendations  State when to call healthcare provider regarding medication questions or post-operative complications  Handouts given during class include:  Pre-Op Bariatric Surgery Diet Handout  Protein Shake Handout  Post-Op Bariatric Surgery Nutrition Handout  BELT Program Information Flyer  Support Group Information Flyer  [facility] Bariatric Supplements Price List  Follow-Up Plan: Patient will follow-up at [facility] 2 weeks post operatively for diet advancement per MD.

## 2016-10-19 ENCOUNTER — Ambulatory Visit: Payer: Self-pay | Admitting: General Surgery

## 2016-10-19 NOTE — H&P (Signed)
History of Present Illness Monica Spruce MD; 10/19/2016 11:46 AM) Patient words: pre-op sleeve.  The patient is a 51 year old female who presents for a bariatric surgery evaluation. She has completed all required workups including 6 months supervised weight loss and is ready to proceed with sleeve yesterday. She was treated with triple therapy for H. pylori and now has resolution of her reflux symptoms while on omeprazole. She denies changes to her diet. She continues to have some intermittent back pain.   Allergies Monica Marks, CMA; 10/19/2016 11:09 AM) No Known Drug Allergies 06/01/2016  Medication History (Monica Marks, CMA; 10/19/2016 11:09 AM) Omeprazole (20MG  Capsule DR, 1 (one) Capsule Oral daily, Taken starting 08/11/2016) Active. ALPRAZolam (0.25MG  Tablet, Oral) Active. Escitalopram Oxalate (20MG  Tablet, Oral) Active. HydroCHLOROthiazide (12.5MG  Capsule, Oral) Active. Latanoprost (0.005% Solution, Ophthalmic) Active. Metoprolol Tartrate (50MG  Tablet, Oral) Active. ProAir HFA (108 (90 Base)MCG/ACT Aerosol Soln, Inhalation) Active. Medications Reconciled    Review of Systems Monica Spruce MD; 10/19/2016 11:46 AM) General Present- Night Sweats and Weight Gain. Not Present- Appetite Loss, Chills, Fatigue, Fever and Weight Loss. Skin Not Present- Change in Wart/Mole, Dryness, Hives, Jaundice, New Lesions, Non-Healing Wounds, Rash and Ulcer. HEENT Present- Seasonal Allergies and Wears glasses/contact lenses. Not Present- Earache, Hearing Loss, Hoarseness, Nose Bleed, Oral Ulcers, Ringing in the Ears, Sinus Pain, Sore Throat, Visual Disturbances and Yellow Eyes. Respiratory Present- Snoring. Not Present- Bloody sputum, Chronic Cough, Difficulty Breathing and Wheezing. Breast Not Present- Breast Mass, Breast Pain, Nipple Discharge and Skin Changes. Cardiovascular Not Present- Chest Pain, Difficulty Breathing Lying Down, Leg Cramps, Palpitations, Rapid Heart Rate,  Shortness of Breath and Swelling of Extremities. Gastrointestinal Present- Bloody Stool. Not Present- Abdominal Pain, Bloating, Change in Bowel Habits, Chronic diarrhea, Constipation, Difficulty Swallowing, Excessive gas, Gets full quickly at meals, Hemorrhoids, Indigestion, Nausea, Rectal Pain and Vomiting. Female Genitourinary Not Present- Frequency, Nocturia, Painful Urination, Pelvic Pain and Urgency. Musculoskeletal Present- Back Pain. Not Present- Joint Pain, Joint Stiffness, Muscle Pain, Muscle Weakness and Swelling of Extremities. Neurological Not Present- Decreased Memory, Fainting, Headaches, Numbness, Seizures, Tingling, Tremor, Trouble walking and Weakness. Psychiatric Present- Anxiety and Depression. Not Present- Bipolar, Change in Sleep Pattern, Fearful and Frequent crying. Endocrine Not Present- Cold Intolerance, Excessive Hunger, Hair Changes, Heat Intolerance, Hot flashes and New Diabetes. Hematology Not Present- Blood Thinners, Easy Bruising, Excessive bleeding, Gland problems, HIV and Persistent Infections.  Vitals (Monica Marks CMA; 10/19/2016 11:08 AM) 10/19/2016 11:08 AM Weight: 225.2 lb Height: 64.5in Body Surface Area: 2.07 m Body Mass Index: 38.06 kg/m  Temp.: 98.47F(Oral)  Pulse: 80 (Regular)  BP: 118/74 (Sitting, Left Arm, Standard)       Assessment & Plan Monica Spruce MD; 10/19/2016 11:48 AM) CLASS 2 OBESITY WITH SERIOUS COMORBIDITY AND BODY MASS INDEX (BMI) OF 37.0 TO 37.9 IN ADULT (E66.22) Story: 51 year old female with multiple medical comorbidities and class II obesity. She has tried multiple things or weight loss in the past with no long-term results. She is most interested in the sleeve gastrectomy. She has a small hiatal hernia on upper GI and we'll fix this time surgery.  We discussed that we'll try and start her beta blocker immediately after surgery. We discussed that her PPI she started within a few days after surgery. We discussed that  her multivitamins should restart within the first week after surgery. Impression: We again discussed the details of the operation: Will be done under general anesthesia with an endotracheal tube, that it will be a laparoscopic procedure with 6  small incisions large one being 1.5-2in, that we will remove remove the short gastrics and other vessels to the greater curve of the stomach, place a large Bougie down the stomach and then remove a percent of the stomach using a linear stapler. We discussed the procedure will take approximately 1.5-2 hours. We discussed risks of VTE, staple line leak, skin infection, sleeve stenosis, incisional hernia, need for open procedure, and postoperative nausea and vomiting. Current Plans Started Zofran ODT 4MG , 1 (one) Tablet Disperse every six hours, as needed, #20, 10/19/2016, No Refill. Started OxyCODONE HCl 5MG /5ML, 5-10 Milliliter every four hours, as needed, 100 Milliliter, 10/19/2016, No Refill.

## 2016-10-26 NOTE — Patient Instructions (Addendum)
SHANDRELL TRAVERS  10/26/2016   Your procedure is scheduled on: 10-31-16  Report to [facility]  Entrance take [facility]  elevators to 3rd floor to  [place] at [phone].  Call this number if you have problems the morning of surgery [phone]   Remember: ONLY 1 PERSON MAY GO WITH YOU TO SHORT STAY TO GET  READY MORNING OF [place].  Do not eat food or drink liquids :After Midnight.   PLEASE BRING CPAP TUBING AND MASK ON DAY OF SURGERY. DEVICE WILL BE PROVIDED FOR YOU!     Take these medicines the morning of surgery with A SIP OF WATER: alprazolam(xanax), escitalopram(lexapro), omeprazole(prilosec), metoprolol(lopressor), eye drops, inhalers as needed                                  You may not have any metal on your body including hair pins and              piercings  Do not wear jewelry, make-up, lotions, powders or perfumes, deodorant             Do not wear nail polish.  Do not shave  48 hours prior to surgery.              Men may shave face and neck.   Do not bring valuables to the hospital. [place].  Contacts, dentures or bridgework may not be worn into surgery.  Leave suitcase in the car. After surgery it may be brought to your room.               Please read over the following fact sheets you were given: _____________________________________________________________________             [facility] - Preparing for Surgery Before surgery, you can play an important role.  Because skin is not sterile, your skin needs to be as free of germs as possible.  You can reduce the number of germs on your skin by washing with CHG (chlorahexidine gluconate) soap before surgery.  CHG is an antiseptic cleaner which kills germs and bonds with the skin to continue killing germs even after washing. Please DO NOT use if you have an allergy to CHG or antibacterial soaps.  If your skin becomes reddened/irritated  stop using the CHG and inform your nurse when you arrive at Short Stay. Do not shave (including legs and underarms) for at least 48 hours prior to the first CHG shower.  You may shave your face/neck. Please follow these instructions carefully:  1.  Shower with CHG Soap the night before surgery and the  morning of Surgery.  2.  If you choose to wash your hair, wash your hair first as usual with your  normal  shampoo.  3.  After you shampoo, rinse your hair and body thoroughly to remove the  shampoo.                           4.  Use CHG as you would any other liquid soap.  You can apply chg directly  to the skin and wash  Gently with a scrungie or clean washcloth.  5.  Apply the CHG Soap to your body ONLY FROM THE NECK DOWN.   Do not use on face/ open                           Wound or open sores. Avoid contact with eyes, ears mouth and genitals (private parts).                       Wash face,  Genitals (private parts) with your normal soap.             6.  Wash thoroughly, paying special attention to the area where your surgery  will be performed.  7.  Thoroughly rinse your body with warm water from the neck down.  8.  DO NOT shower/wash with your normal soap after using and rinsing off  the CHG Soap.                9.  Pat yourself dry with a clean towel.            10.  Wear clean pajamas.            11.  Place clean sheets on your bed the night of your first shower and do not  sleep with pets. Day of Surgery : Do not apply any lotions/deodorants the morning of surgery.  Please wear clean clothes to the hospital/surgery center.  FAILURE TO FOLLOW THESE INSTRUCTIONS MAY RESULT IN THE CANCELLATION OF YOUR SURGERY PATIENT SIGNATURE_________________________________  NURSE SIGNATURE__________________________________  ________________________________________________________________________

## 2016-10-26 NOTE — Progress Notes (Signed)
lov cardio dr Lovena Le 10-30-15 epic cxr 06-14-16 epic ekg 06-14-16 epic

## 2016-10-27 ENCOUNTER — Encounter (HOSPITAL_COMMUNITY)
Admission: RE | Admit: 2016-10-27 | Discharge: 2016-10-27 | Disposition: A | Discharge status: Home / Self Care | Payer: BC Managed Care – PPO | Source: Ambulatory Visit | Attending: General Surgery | Admitting: General Surgery

## 2016-10-27 ENCOUNTER — Encounter (HOSPITAL_COMMUNITY): Payer: Self-pay

## 2016-10-27 DIAGNOSIS — Z01818 Encounter for other preprocedural examination: Secondary | ICD-10-CM | POA: Insufficient documentation

## 2016-10-27 DIAGNOSIS — E669 Obesity, unspecified: Secondary | ICD-10-CM | POA: Diagnosis not present

## 2016-10-27 HISTORY — DX: Sleep apnea, unspecified: G47.30

## 2016-10-27 LAB — CBC WITH DIFFERENTIAL/PLATELET
BASOS ABS: 0 10*3/uL (ref 0.0–0.1)
BASOS PCT: 0 %
Eosinophils Absolute: 0.2 10*3/uL (ref 0.0–0.7)
Eosinophils Relative: 2 %
HEMATOCRIT: 39.1 % (ref 36.0–46.0)
HEMOGLOBIN: 13.5 g/dL (ref 12.0–15.0)
LYMPHS PCT: 16 %
Lymphs Abs: 1.3 10*3/uL (ref 0.7–4.0)
MCH: 31 pg (ref 26.0–34.0)
MCHC: 34.5 g/dL (ref 30.0–36.0)
MCV: 89.7 fL (ref 78.0–100.0)
MONO ABS: 0.5 10*3/uL (ref 0.1–1.0)
MONOS PCT: 6 %
NEUTROS ABS: 6 10*3/uL (ref 1.7–7.7)
NEUTROS PCT: 76 %
Platelets: 230 10*3/uL (ref 150–400)
RBC: 4.36 MIL/uL (ref 3.87–5.11)
RDW: 12.5 % (ref 11.5–15.5)
WBC: 8 10*3/uL (ref 4.0–10.5)

## 2016-10-27 LAB — COMPREHENSIVE METABOLIC PANEL
ALBUMIN: 4.4 g/dL (ref 3.5–5.0)
ALK PHOS: 83 U/L (ref 38–126)
ALT: 42 U/L (ref 14–54)
AST: 28 U/L (ref 15–41)
Anion gap: 8 (ref 5–15)
BILIRUBIN TOTAL: 0.9 mg/dL (ref 0.3–1.2)
BUN: 17 mg/dL (ref 6–20)
CALCIUM: 9.5 mg/dL (ref 8.9–10.3)
CO2: 28 mmol/L (ref 22–32)
Chloride: 101 mmol/L (ref 101–111)
Creatinine, Ser: 0.68 mg/dL (ref 0.44–1.00)
GFR calc Af Amer: >60 mL/min (ref >60)
GLUCOSE: 102 mg/dL — AB (ref 65–99)
Potassium: 3.6 mmol/L (ref 3.5–5.1)
Sodium: 137 mmol/L (ref 135–145)
TOTAL PROTEIN: 7.3 g/dL (ref 6.5–8.1)

## 2016-10-27 MED FILL — oxyCODONE HCL 5 MG/5ML SOLN: 5 | 2 days supply | Qty: 100 | Fill #0

## 2016-10-30 NOTE — Anesthesia Preprocedure Evaluation (Addendum)
Anesthesia Evaluation  Patient identified by MRN, date of birth, ID band Patient awake    Reviewed: Allergy & Precautions, NPO status , Patient's Chart, lab work & pertinent test results  Airway Mallampati: I       Dental   Pulmonary former smoker,    Pulmonary exam normal        Cardiovascular hypertension, Pt. on medications Normal cardiovascular exam     Neuro/Psych Seizures -,     GI/Hepatic   Endo/Other    Renal/GU      Musculoskeletal   Abdominal Normal abdominal exam  (+) + obese,   Peds  Hematology   Anesthesia Other Findings Study Result   Result status: Final result [facility]          [address], [place] [address]            [phone]  ------------------------------------------------------------ Transthoracic Echocardiography  Patient:  Monica Marks, Monica Marks MR #:    [id number] Study Date: 08/20/2013 Gender:   F Age:    51 Height:   165.1cm Weight:   97.1kg BSA:    2.32m^2 Pt. Status: Room:  PERFORMING  [facility], Echo ATTENDING  Default, Provider W REFERRING  London Pepper SONOGRAPHER Coralyn Helling cc:  ------------------------------------------------------------ LV EF: 60% -  65%  ------------------------------------------------------------ Indications:   [id number] Cardiac murmur.  ------------------------------------------------------------ History:  PMH: Acquired from the patient and from the patient's chart. Murmur. Risk factors: Hypertension.  ------------------------------------------------------------ Study Conclusions  - Left ventricle: There appears to be some Chordal SAM with turbulence in the LVOT with a resting LVOT gradient of 46mmHg which increased to 87mmHg with Valsalva The cavity size was normal. There was mild concentric and  severe asymmetric hypertrophy, consistent with hypertrophic cardiomyopathy. Systolic function was normal. The estimated ejection fraction was in the range of 60% to 65%. Wall motion was normal; there were no regional wall motion abnormalities. There was an increased relative contribution of atrial contraction to ventricular filling. Doppler parameters are consistent with abnormal left ventricular relaxation (grade 1 diastolic dysfunction). - Left atrium: The atrium was mildly dilated. - Atrial septum: A patent foramen ovale cannot be excluded. - Pericardium, extracardiac: There was a left pleural effusion. Recommendations: Consider study if clinically indicated with agitated saline contrast administration. Transthoracic echocardiography. M-mode, complete 2D, spectral Doppler, and color Doppler. Height: Height: 165.1cm. Height: 65in. Weight: Weight: 97.1kg. Weight: 213.6lb. Body mass index: BMI: 35.6kg/m^2. Body surface area:  BSA: 2.46m^2. Blood pressure:   134/88. Patient status: Outpatient. Location: [facility]  ------------------------------------------------------------    Reproductive/Obstetrics                            Anesthesia Physical Anesthesia Plan  ASA: II  Anesthesia Plan: General   Post-op Pain Management:    Induction: Intravenous  Airway Management Planned: Oral ETT  Additional Equipment:   Intra-op Plan:   Post-operative Plan: Extubation in OR  Informed Consent: I have reviewed the patients History and Physical, chart, labs and discussed the procedure including the risks, benefits and alternatives for the proposed anesthesia with the patient or authorized representative who has indicated his/her understanding and acceptance.     Plan Discussed with: CRNA and Surgeon  Anesthesia Plan Comments:        Anesthesia Quick Evaluation  Anesthesia  Evaluation  Patient identified by MRN, date of birth, ID band Patient awake    Reviewed: Allergy & Precautions, H&P , NPO status , Patient's Chart, lab work & pertinent test results  Airway Mallampati: II TM Distance: >3 FB Neck ROM: full    Dental No notable dental hx. (+) Teeth Intact   Pulmonary asthma , former smoker,  breath sounds clear to auscultation  Pulmonary exam normal       Cardiovascular hypertension, On Medications Rhythm:regular Rate:Normal     Neuro/Psych  Headaches, negative psych ROS   GI/Hepatic negative GI ROS, Neg liver ROS,   Endo/Other  negative endocrine ROS  Renal/GU negative Renal ROS  negative genitourinary   Musculoskeletal   Abdominal   Peds  Hematology negative hematology ROS (+)   Anesthesia Other Findings   Reproductive/Obstetrics Fibroids DUB                           Anesthesia Physical Anesthesia Plan  ASA: II  Anesthesia Plan: General ETT   Post-op Pain Management:    Induction:   Airway Management Planned:   Additional Equipment:   Intra-op Plan:   Post-operative Plan:   Informed Consent: I have reviewed the patients History and Physical, chart, labs and discussed the procedure including the risks, benefits and alternatives for the proposed anesthesia with the patient or authorized representative who has indicated his/her understanding and acceptance.   Dental Advisory Given  Plan Discussed with: Anesthesiologist, CRNA and Surgeon  Anesthesia Plan Comments:         Anesthesia Quick Evaluation                                   Anesthesia Evaluation  Patient identified by MRN, date of birth, ID band Patient awake    Reviewed: Allergy & Precautions, H&P , NPO status , Patient's Chart, lab work & pertinent test results  Airway Mallampati: II TM Distance: >3 FB Neck ROM: full    Dental No notable dental hx. (+) Teeth Intact   Pulmonary asthma , former  smoker,  breath sounds clear to auscultation  Pulmonary exam normal       Cardiovascular hypertension, On Medications Rhythm:regular Rate:Normal     Neuro/Psych  Headaches, negative psych ROS   GI/Hepatic negative GI ROS, Neg liver ROS,   Endo/Other  negative endocrine ROS  Renal/GU negative Renal ROS  negative genitourinary   Musculoskeletal   Abdominal   Peds  Hematology negative hematology ROS (+)   Anesthesia Other Findings   Reproductive/Obstetrics Fibroids DUB                           Anesthesia Physical Anesthesia Plan  ASA: II  Anesthesia Plan: General ETT   Post-op Pain Management:    Induction:   Airway Management Planned:   Additional Equipment:   Intra-op Plan:   Post-operative Plan:   Informed Consent: I have reviewed the patients History and Physical, chart, labs and discussed the procedure including the risks, benefits and alternatives for the proposed anesthesia with the patient or authorized representative who has indicated his/her understanding and acceptance.   Dental Advisory Given  Plan Discussed with: Anesthesiologist, CRNA and Surgeon  Anesthesia Plan Comments:         Anesthesia Quick Evaluation  Anesthesia Evaluation  Patient identified by MRN, date of birth, ID band Patient awake    Reviewed: Allergy & Precautions, H&P , NPO status , Patient's Chart, lab work & pertinent test results  Airway Mallampati: II TM Distance: >3 FB Neck ROM: full    Dental No notable dental hx. (+) Teeth Intact   Pulmonary asthma , former smoker,  breath sounds clear to auscultation  Pulmonary exam normal       Cardiovascular hypertension, On Medications Rhythm:regular Rate:Normal     Neuro/Psych  Headaches, negative psych ROS   GI/Hepatic negative GI ROS, Neg liver ROS,   Endo/Other  negative endocrine ROS  Renal/GU negative Renal ROS  negative  genitourinary   Musculoskeletal   Abdominal   Peds  Hematology negative hematology ROS (+)   Anesthesia Other Findings   Reproductive/Obstetrics Fibroids DUB                           Anesthesia Physical Anesthesia Plan  ASA: II  Anesthesia Plan: General ETT   Post-op Pain Management:    Induction:   Airway Management Planned:   Additional Equipment:   Intra-op Plan:   Post-operative Plan:   Informed Consent: I have reviewed the patients History and Physical, chart, labs and discussed the procedure including the risks, benefits and alternatives for the proposed anesthesia with the patient or authorized representative who has indicated his/her understanding and acceptance.   Dental Advisory Given  Plan Discussed with: Anesthesiologist, CRNA and Surgeon  Anesthesia Plan Comments:         Anesthesia Quick Evaluation

## 2016-10-31 ENCOUNTER — Inpatient Hospital Stay (HOSPITAL_COMMUNITY): Payer: BC Managed Care – PPO | Admitting: Anesthesiology

## 2016-10-31 ENCOUNTER — Inpatient Hospital Stay (HOSPITAL_COMMUNITY)
Admission: RE | Admit: 2016-10-31 | Discharge: 2016-11-01 | DRG: 620 | Disposition: A | Discharge status: Home / Self Care | Payer: BC Managed Care – PPO | Source: Ambulatory Visit | Attending: General Surgery | Admitting: General Surgery

## 2016-10-31 ENCOUNTER — Encounter (HOSPITAL_COMMUNITY)
Admission: RE | Disposition: A | Discharge status: Home / Self Care | Payer: Self-pay | Source: Ambulatory Visit | Attending: General Surgery

## 2016-10-31 ENCOUNTER — Encounter (HOSPITAL_COMMUNITY): Payer: Self-pay | Admitting: *Deleted

## 2016-10-31 DIAGNOSIS — Z8249 Family history of ischemic heart disease and other diseases of the circulatory system: Secondary | ICD-10-CM

## 2016-10-31 DIAGNOSIS — Z90710 Acquired absence of both cervix and uterus: Secondary | ICD-10-CM | POA: Diagnosis not present

## 2016-10-31 DIAGNOSIS — Z801 Family history of malignant neoplasm of trachea, bronchus and lung: Secondary | ICD-10-CM | POA: Diagnosis not present

## 2016-10-31 DIAGNOSIS — H409 Unspecified glaucoma: Secondary | ICD-10-CM | POA: Diagnosis present

## 2016-10-31 DIAGNOSIS — Z808 Family history of malignant neoplasm of other organs or systems: Secondary | ICD-10-CM | POA: Diagnosis not present

## 2016-10-31 DIAGNOSIS — Z6837 Body mass index (BMI) 37.0-37.9, adult: Secondary | ICD-10-CM | POA: Diagnosis not present

## 2016-10-31 DIAGNOSIS — J45909 Unspecified asthma, uncomplicated: Secondary | ICD-10-CM | POA: Diagnosis present

## 2016-10-31 DIAGNOSIS — I422 Other hypertrophic cardiomyopathy: Secondary | ICD-10-CM | POA: Diagnosis present

## 2016-10-31 DIAGNOSIS — K449 Diaphragmatic hernia without obstruction or gangrene: Secondary | ICD-10-CM | POA: Diagnosis present

## 2016-10-31 DIAGNOSIS — G4730 Sleep apnea, unspecified: Secondary | ICD-10-CM | POA: Diagnosis present

## 2016-10-31 DIAGNOSIS — I10 Essential (primary) hypertension: Secondary | ICD-10-CM | POA: Diagnosis present

## 2016-10-31 DIAGNOSIS — Z87891 Personal history of nicotine dependence: Secondary | ICD-10-CM | POA: Diagnosis not present

## 2016-10-31 DIAGNOSIS — I421 Obstructive hypertrophic cardiomyopathy: Secondary | ICD-10-CM | POA: Diagnosis present

## 2016-10-31 DIAGNOSIS — IMO0001 Reserved for inherently not codable concepts without codable children: Secondary | ICD-10-CM

## 2016-10-31 DIAGNOSIS — E669 Obesity, unspecified: Secondary | ICD-10-CM | POA: Diagnosis present

## 2016-10-31 HISTORY — PX: UPPER GI ENDOSCOPY: SHX6162

## 2016-10-31 HISTORY — PX: LAPAROSCOPIC GASTRIC SLEEVE RESECTION WITH HIATAL HERNIA REPAIR: SHX6512

## 2016-10-31 LAB — CREATININE, SERUM: Creatinine, Ser: 0.88 mg/dL (ref 0.44–1.00)

## 2016-10-31 LAB — CBC
HEMATOCRIT: 39.2 % (ref 36.0–46.0)
HEMOGLOBIN: 13.2 g/dL (ref 12.0–15.0)
MCH: 30.1 pg (ref 26.0–34.0)
MCHC: 33.7 g/dL (ref 30.0–36.0)
MCV: 89.3 fL (ref 78.0–100.0)
Platelets: 223 10*3/uL (ref 150–400)
RBC: 4.39 MIL/uL (ref 3.87–5.11)
RDW: 12.6 % (ref 11.5–15.5)
WBC: 12 10*3/uL — ABNORMAL HIGH (ref 4.0–10.5)

## 2016-10-31 SURGERY — GASTRECTOMY, SLEEVE, LAPAROSCOPIC, WITH HIATAL HERNIA REPAIR
Anesthesia: General | Site: Abdomen

## 2016-10-31 MED ORDER — PROPOFOL 10 MG/ML IV BOLUS
INTRAVENOUS | Status: AC
  Filled 2016-10-31: qty 40

## 2016-10-31 MED ORDER — CHLORHEXIDINE GLUCONATE 4 % EX LIQD: 60.0000 mL | Freq: Once | CUTANEOUS | Status: DC

## 2016-10-31 MED ORDER — BUPIVACAINE HCL 0.25 % IJ SOLN
INTRAMUSCULAR | Status: DC | PRN
  Administered 2016-10-31: 30 mL

## 2016-10-31 MED ORDER — HYDROMORPHONE HCL 1 MG/ML IJ SOLN
INTRAMUSCULAR | Status: AC
  Filled 2016-10-31: qty 1

## 2016-10-31 MED ORDER — PROMETHAZINE HCL 25 MG/ML IJ SOLN
INTRAMUSCULAR | Status: AC
  Filled 2016-10-31: qty 1

## 2016-10-31 MED ORDER — ROCURONIUM BROMIDE 50 MG/5ML IV SOSY
PREFILLED_SYRINGE | INTRAVENOUS | Status: AC
  Filled 2016-10-31: qty 5

## 2016-10-31 MED ORDER — SUFENTANIL CITRATE 50 MCG/ML IV SOLN
INTRAVENOUS | Status: DC | PRN
  Administered 2016-10-31: 10 ug via INTRAVENOUS

## 2016-10-31 MED ORDER — CEFOTETAN DISODIUM-DEXTROSE 2-2.08 GM-% IV SOLR
INTRAVENOUS | Status: AC
  Filled 2016-10-31: qty 50

## 2016-10-31 MED ORDER — ALBUTEROL SULFATE (2.5 MG/3ML) 0.083% IN NEBU
2.5000 mg | INHALATION_SOLUTION | RESPIRATORY_TRACT | Status: DC | PRN
Start: 2016-10-31 — End: 2016-11-01

## 2016-10-31 MED ORDER — SUGAMMADEX SODIUM 500 MG/5ML IV SOLN
INTRAVENOUS | Status: AC
Start: 2016-10-31 — End: 2016-10-31
  Filled 2016-10-31: qty 5

## 2016-10-31 MED ORDER — KETOROLAC TROMETHAMINE 30 MG/ML IJ SOLN: 30.0000 mg | Freq: Once | INTRAMUSCULAR | Status: DC

## 2016-10-31 MED ORDER — 0.9 % SODIUM CHLORIDE (POUR BTL) OPTIME
TOPICAL | Status: DC | PRN
  Administered 2016-10-31: 1000 mL

## 2016-10-31 MED ORDER — PREMIER PROTEIN SHAKE
2.0000 [oz_av] | ORAL | Status: DC
  Administered 2016-11-01: 2 [oz_av] via ORAL

## 2016-10-31 MED ORDER — GLYCOPYRROLATE 0.2 MG/ML IV SOSY
PREFILLED_SYRINGE | INTRAVENOUS | Status: AC
  Filled 2016-10-31: qty 5

## 2016-10-31 MED ORDER — MORPHINE SULFATE (PF) 4 MG/ML IV SOLN
1.0000 mg | INTRAVENOUS | Status: DC | PRN
  Administered 2016-10-31 (×2): 1 mg via INTRAVENOUS
  Filled 2016-10-31 (×2): qty 1

## 2016-10-31 MED ORDER — METOPROLOL TARTRATE 25 MG PO TABS
25.0000 mg | ORAL_TABLET | Freq: Two times a day (BID) | ORAL | Status: DC
Start: 2016-10-31 — End: 2016-11-01
  Administered 2016-10-31 – 2016-11-01 (×2): 25 mg via ORAL
  Filled 2016-10-31 (×2): qty 1

## 2016-10-31 MED ORDER — DEXAMETHASONE SODIUM PHOSPHATE 10 MG/ML IJ SOLN
INTRAMUSCULAR | Status: AC
  Filled 2016-10-31: qty 1

## 2016-10-31 MED ORDER — ONDANSETRON HCL 4 MG/2ML IJ SOLN
INTRAMUSCULAR | Status: DC | PRN
  Administered 2016-10-31: 4 mg via INTRAVENOUS

## 2016-10-31 MED ORDER — SCOPOLAMINE 1 MG/3DAYS TD PT72
MEDICATED_PATCH | TRANSDERMAL | Status: AC
  Filled 2016-10-31: qty 1

## 2016-10-31 MED ORDER — MIDAZOLAM HCL 2 MG/2ML IJ SOLN
INTRAMUSCULAR | Status: AC
  Filled 2016-10-31: qty 2

## 2016-10-31 MED ORDER — ENOXAPARIN SODIUM 30 MG/0.3ML ~~LOC~~ SOLN
30.0000 mg | Freq: Two times a day (BID) | SUBCUTANEOUS | Status: DC
  Administered 2016-11-01: 30 mg via SUBCUTANEOUS
  Filled 2016-10-31: qty 0.3

## 2016-10-31 MED ORDER — SUFENTANIL CITRATE 50 MCG/ML IV SOLN
INTRAVENOUS | Status: AC
  Filled 2016-10-31: qty 1

## 2016-10-31 MED ORDER — SUCCINYLCHOLINE CHLORIDE 200 MG/10ML IV SOSY
PREFILLED_SYRINGE | INTRAVENOUS | Status: AC
  Filled 2016-10-31: qty 10

## 2016-10-31 MED ORDER — CEFOTETAN DISODIUM-DEXTROSE 2-2.08 GM-% IV SOLR: 2.0000 g | INTRAVENOUS | Status: DC

## 2016-10-31 MED ORDER — ONDANSETRON HCL 4 MG/2ML IJ SOLN
INTRAMUSCULAR | Status: AC
  Filled 2016-10-31: qty 2

## 2016-10-31 MED ORDER — ONDANSETRON HCL 4 MG/2ML IJ SOLN: 4.0000 mg | INTRAMUSCULAR | Status: DC | PRN

## 2016-10-31 MED ORDER — HEPARIN SODIUM (PORCINE) 5000 UNIT/ML IJ SOLN
5000.0000 [IU] | INTRAMUSCULAR | Status: AC
  Administered 2016-10-31: 5000 [IU] via SUBCUTANEOUS
  Filled 2016-10-31: qty 1

## 2016-10-31 MED ORDER — LACTATED RINGERS IR SOLN
Status: DC | PRN
Start: 2016-10-31 — End: 2016-10-31
  Administered 2016-10-31: 1000 mL

## 2016-10-31 MED ORDER — PANTOPRAZOLE SODIUM 40 MG IV SOLR
40.0000 mg | Freq: Every day | INTRAVENOUS | Status: DC
  Administered 2016-10-31: 40 mg via INTRAVENOUS
  Filled 2016-10-31: qty 40

## 2016-10-31 MED ORDER — ALPRAZOLAM 0.25 MG PO TABS: 0.2500 mg | ORAL_TABLET | Freq: Three times a day (TID) | ORAL | Status: DC | PRN

## 2016-10-31 MED ORDER — PHENYLEPHRINE 40 MCG/ML (10ML) SYRINGE FOR IV PUSH (FOR BLOOD PRESSURE SUPPORT)
PREFILLED_SYRINGE | INTRAVENOUS | Status: DC | PRN
  Administered 2016-10-31: 120 ug via INTRAVENOUS
  Administered 2016-10-31: 80 ug via INTRAVENOUS
  Administered 2016-10-31: 120 ug via INTRAVENOUS
  Administered 2016-10-31: 80 ug via INTRAVENOUS
  Administered 2016-10-31: 120 ug via INTRAVENOUS
  Administered 2016-10-31 (×3): 80 ug via INTRAVENOUS

## 2016-10-31 MED ORDER — DEXTROSE-NACL 5-0.45 % IV SOLN
INTRAVENOUS | Status: DC
  Administered 2016-10-31 (×2): via INTRAVENOUS

## 2016-10-31 MED ORDER — DEXTROSE 5 % IV SOLN
INTRAVENOUS | Status: DC | PRN
  Administered 2016-10-31: 2 g via INTRAVENOUS

## 2016-10-31 MED ORDER — ACETAMINOPHEN 160 MG/5ML PO SOLN
325.0000 mg | ORAL | Status: DC | PRN
  Administered 2016-11-01: 650 mg via ORAL
  Filled 2016-10-31: qty 20.3

## 2016-10-31 MED ORDER — MEPERIDINE HCL 50 MG/ML IJ SOLN: 6.2500 mg | INTRAMUSCULAR | Status: DC | PRN

## 2016-10-31 MED ORDER — SODIUM CHLORIDE 0.9 % IJ SOLN
INTRAMUSCULAR | Status: AC
  Filled 2016-10-31: qty 30

## 2016-10-31 MED ORDER — PROMETHAZINE HCL 25 MG/ML IJ SOLN
6.2500 mg | INTRAMUSCULAR | Status: DC | PRN
  Administered 2016-10-31: 12.5 mg via INTRAVENOUS

## 2016-10-31 MED ORDER — PHENYLEPHRINE 40 MCG/ML (10ML) SYRINGE FOR IV PUSH (FOR BLOOD PRESSURE SUPPORT)
PREFILLED_SYRINGE | INTRAVENOUS | Status: AC
  Filled 2016-10-31: qty 10

## 2016-10-31 MED ORDER — ROCURONIUM BROMIDE 10 MG/ML (PF) SYRINGE
PREFILLED_SYRINGE | INTRAVENOUS | Status: DC | PRN
Start: 2016-10-31 — End: 2016-10-31
  Administered 2016-10-31: 15 mg via INTRAVENOUS
  Administered 2016-10-31: 45 mg via INTRAVENOUS
  Administered 2016-10-31: 5 mg via INTRAVENOUS
  Administered 2016-10-31: 10 mg via INTRAVENOUS

## 2016-10-31 MED ORDER — PROPOFOL 10 MG/ML IV BOLUS
INTRAVENOUS | Status: DC | PRN
  Administered 2016-10-31: 200 mg via INTRAVENOUS

## 2016-10-31 MED ORDER — ACETAMINOPHEN 325 MG PO TABS
650.0000 mg | ORAL_TABLET | ORAL | Status: DC | PRN
Start: 2016-10-31 — End: 2016-11-01

## 2016-10-31 MED ORDER — MIDAZOLAM HCL 5 MG/5ML IJ SOLN
INTRAMUSCULAR | Status: DC | PRN
  Administered 2016-10-31: 2 mg via INTRAVENOUS

## 2016-10-31 MED ORDER — PHENYLEPHRINE HCL 10 MG/ML IJ SOLN
INTRAMUSCULAR | Status: DC | PRN
  Administered 2016-10-31: 25 ug/min via INTRAVENOUS

## 2016-10-31 MED ORDER — OXYCODONE HCL 5 MG/5ML PO SOLN
5.0000 mg | ORAL | Status: DC | PRN
  Administered 2016-10-31: 5 mg via ORAL
  Administered 2016-11-01 (×2): 10 mg via ORAL
  Administered 2016-11-01: 5 mg via ORAL
  Filled 2016-10-31 (×2): qty 5
  Filled 2016-10-31: qty 10
  Filled 2016-10-31: qty 5
  Filled 2016-10-31: qty 10

## 2016-10-31 MED ORDER — LIDOCAINE 2% (20 MG/ML) 5 ML SYRINGE
INTRAMUSCULAR | Status: AC
  Filled 2016-10-31: qty 5

## 2016-10-31 MED ORDER — GLYCOPYRROLATE 0.2 MG/ML IV SOSY
PREFILLED_SYRINGE | INTRAVENOUS | Status: DC | PRN
  Administered 2016-10-31: .2 mg via INTRAVENOUS

## 2016-10-31 MED ORDER — HYDROMORPHONE HCL 1 MG/ML IJ SOLN
0.2500 mg | INTRAMUSCULAR | Status: DC | PRN
  Administered 2016-10-31 (×4): 0.5 mg via INTRAVENOUS

## 2016-10-31 MED ORDER — ORAL CARE MOUTH RINSE
15.0000 mL | Freq: Two times a day (BID) | OROMUCOSAL | Status: DC
  Administered 2016-10-31: 15 mL via OROMUCOSAL

## 2016-10-31 MED ORDER — LACTATED RINGERS IV SOLN
INTRAVENOUS | Status: DC | PRN
  Administered 2016-10-31 (×2): via INTRAVENOUS

## 2016-10-31 MED ORDER — ALBUTEROL SULFATE HFA 108 (90 BASE) MCG/ACT IN AERS: 1.0000 | INHALATION_SPRAY | RESPIRATORY_TRACT | Status: DC | PRN

## 2016-10-31 MED ORDER — SUGAMMADEX SODIUM 500 MG/5ML IV SOLN
INTRAVENOUS | Status: DC | PRN
  Administered 2016-10-31: 225 mg via INTRAVENOUS

## 2016-10-31 MED ORDER — BUPIVACAINE HCL (PF) 0.25 % IJ SOLN
INTRAMUSCULAR | Status: AC
  Filled 2016-10-31: qty 30

## 2016-10-31 MED ORDER — DEXAMETHASONE SODIUM PHOSPHATE 10 MG/ML IJ SOLN
INTRAMUSCULAR | Status: DC | PRN
  Administered 2016-10-31: 10 mg via INTRAVENOUS

## 2016-10-31 MED ORDER — BUPIVACAINE LIPOSOME 1.3 % IJ SUSP
20.0000 mL | Freq: Once | INTRAMUSCULAR | Status: DC
  Filled 2016-10-31: qty 20

## 2016-10-31 MED ORDER — CHLORHEXIDINE GLUCONATE 0.12 % MT SOLN
15.0000 mL | Freq: Two times a day (BID) | OROMUCOSAL | Status: DC
  Administered 2016-10-31 (×2): 15 mL via OROMUCOSAL
  Filled 2016-10-31 (×2): qty 15

## 2016-10-31 MED ORDER — SCOPOLAMINE 1 MG/3DAYS TD PT72
MEDICATED_PATCH | TRANSDERMAL | Status: DC | PRN
  Administered 2016-10-31: 1 via TRANSDERMAL

## 2016-10-31 MED ORDER — BUPIVACAINE LIPOSOME 1.3 % IJ SUSP
INTRAMUSCULAR | Status: DC | PRN
  Administered 2016-10-31: 20 mL

## 2016-10-31 MED ORDER — LIDOCAINE 2% (20 MG/ML) 5 ML SYRINGE
INTRAMUSCULAR | Status: DC | PRN
  Administered 2016-10-31: 100 mg via INTRAVENOUS

## 2016-10-31 SURGICAL SUPPLY — 68 items
APL SKNCLS STERI-STRIP NONHPOA (GAUZE/BANDAGES/DRESSINGS)
APPLIER CLIP 5 13 M/L LIGAMAX5 (MISCELLANEOUS)
APPLIER CLIP ROT 10 11.4 M/L (STAPLE)
APPLIER CLIP ROT 13.4 12 LRG (CLIP)
APR CLP LRG 13.4X12 ROT 20 MLT (CLIP)
APR CLP MED LRG 11.4X10 (STAPLE)
APR CLP MED LRG 5 ANG JAW (MISCELLANEOUS)
BAG LAPAROSCOPIC 12 15 PORT 16 (BASKET) ×2 IMPLANT
BAG RETRIEVAL 12/15 (BASKET) ×3
BANDAGE ADH SHEER 1  50/CT (GAUZE/BANDAGES/DRESSINGS) ×10 IMPLANT
BENZOIN TINCTURE PRP APPL 2/3 (GAUZE/BANDAGES/DRESSINGS) ×2 IMPLANT
BLADE SURG SZ11 CARB STEEL (BLADE) ×3 IMPLANT
CABLE HIGH FREQUENCY MONO STRZ (ELECTRODE) ×3 IMPLANT
CHLORAPREP W/TINT 26ML (MISCELLANEOUS) ×4 IMPLANT
CLIP APPLIE 5 13 M/L LIGAMAX5 (MISCELLANEOUS) IMPLANT
CLIP APPLIE ROT 10 11.4 M/L (STAPLE) IMPLANT
CLIP APPLIE ROT 13.4 12 LRG (CLIP) IMPLANT
COVER SURGICAL LIGHT HANDLE (MISCELLANEOUS) ×2 IMPLANT
DRAIN CHANNEL 19F RND (DRAIN) IMPLANT
ELECT REM PT RETURN 9FT ADLT (ELECTROSURGICAL) ×3
ELECTRODE REM PT RTRN 9FT ADLT (ELECTROSURGICAL) ×2 IMPLANT
EVACUATOR SILICONE 100CC (DRAIN) IMPLANT
GAUZE SPONGE 2X2 8PLY STRL LF (GAUZE/BANDAGES/DRESSINGS) IMPLANT
GAUZE SPONGE 4X4 12PLY STRL (GAUZE/BANDAGES/DRESSINGS) IMPLANT
GLOVE BIOGEL PI IND STRL 7.0 (GLOVE) ×2 IMPLANT
GLOVE BIOGEL PI INDICATOR 7.0 (GLOVE) ×1
GLOVE SURG SS PI 7.0 STRL IVOR (GLOVE) ×3 IMPLANT
GOWN STRL REUS W/TWL LRG LVL3 (GOWN DISPOSABLE) ×5 IMPLANT
GOWN STRL REUS W/TWL XL LVL3 (GOWN DISPOSABLE) ×7 IMPLANT
GRASPER SUT TROCAR 14GX15 (MISCELLANEOUS) ×3 IMPLANT
HANDLE STAPLE EGIA 4 XL (STAPLE) ×3 IMPLANT
HOVERMATT SINGLE USE (MISCELLANEOUS) ×3 IMPLANT
KIT BASIN OR (CUSTOM PROCEDURE TRAY) ×3 IMPLANT
MARKER SKIN DUAL TIP RULER LAB (MISCELLANEOUS) ×3 IMPLANT
NDL SPNL 22GX3.5 QUINCKE BK (NEEDLE) ×2 IMPLANT
NEEDLE SPNL 22GX3.5 QUINCKE BK (NEEDLE) ×3 IMPLANT
PACK CARDIOVASCULAR III (CUSTOM PROCEDURE TRAY) ×3 IMPLANT
RELOAD EGIA 45 MED/THCK PURPLE (STAPLE) ×1 IMPLANT
RELOAD STAPLE 45 PURP MED/THCK (STAPLE) IMPLANT
RELOAD TRI 45 ART MED THCK BLK (STAPLE) IMPLANT
RELOAD TRI 45 ART MED THCK PUR (STAPLE) IMPLANT
RELOAD TRI 60 ART MED THCK BLK (STAPLE) ×6 IMPLANT
RELOAD TRI 60 ART MED THCK PUR (STAPLE) ×3 IMPLANT
SCISSORS LAP 5X45 EPIX DISP (ENDOMECHANICALS) ×3 IMPLANT
SET IRRIG TUBING LAPAROSCOPIC (IRRIGATION / IRRIGATOR) ×3 IMPLANT
SHEARS HARMONIC ACE PLUS 45CM (MISCELLANEOUS) ×3 IMPLANT
SLEEVE GASTRECTOMY 40FR VISIGI (MISCELLANEOUS) ×3 IMPLANT
SLEEVE XCEL OPT CAN 5 100 (ENDOMECHANICALS) ×7 IMPLANT
SOLUTION ANTI FOG 6CC (MISCELLANEOUS) ×3 IMPLANT
SPONGE GAUZE 2X2 STER 10/PKG (GAUZE/BANDAGES/DRESSINGS) ×1
SPONGE LAP 18X18 X RAY DECT (DISPOSABLE) ×3 IMPLANT
STRIP CLOSURE SKIN 1/2X4 (GAUZE/BANDAGES/DRESSINGS) ×2 IMPLANT
SUT ETHIBOND 0 36 GRN (SUTURE) ×1 IMPLANT
SUT ETHILON 2 0 PS N (SUTURE) IMPLANT
SUT MNCRL AB 4-0 PS2 18 (SUTURE) ×3 IMPLANT
SUT SILK 0 SH 30 (SUTURE) IMPLANT
SUT VICRYL 0 TIES 12 18 (SUTURE) ×3 IMPLANT
SYR 20CC LL (SYRINGE) ×3 IMPLANT
SYR 50ML LL SCALE MARK (SYRINGE) ×3 IMPLANT
TAPE PAPER 3X10 WHT MICROPORE (GAUZE/BANDAGES/DRESSINGS) ×1 IMPLANT
TOWEL OR 17X26 10 PK STRL BLUE (TOWEL DISPOSABLE) ×3 IMPLANT
TOWEL OR NON WOVEN STRL DISP B (DISPOSABLE) ×3 IMPLANT
TROCAR BLADELESS 15MM (ENDOMECHANICALS) ×3 IMPLANT
TROCAR BLADELESS OPT 5 100 (ENDOMECHANICALS) ×3 IMPLANT
TUBE CALIBRATION LAPBAND (TUBING) ×1 IMPLANT
TUBING CONNECTING 10 (TUBING) ×3 IMPLANT
TUBING ENDO SMARTCAP (MISCELLANEOUS) ×3 IMPLANT
TUBING INSUF HEATED (TUBING) ×3 IMPLANT

## 2016-10-31 NOTE — Op Note (Signed)
Preop Diagnosis: Obesity Class III  Postop Diagnosis: same  Procedure performed: laparoscopic Sleeve Gastrectomy  Assitant: Romana Juniper  Indications:  The patient is a 51 y.o. year-old morbidly obese female who has been followed in the Bariatric Clinic as an outpatient. This patient was diagnosed with morbid obesity with a BMI of Body mass index is 37.15 kg/m. and significant co-morbidities including congestive heart failure.  The patient was counseled extensively in the Bariatric Outpatient Clinic and after a thorough explanation of the risks and benefits of surgery (including death from complications, bowel leak, infection such as peritonitis and/or sepsis, internal hernia, bleeding, need for blood transfusion, bowel obstruction, organ failure, pulmonary embolus, deep venous thrombosis, wound infection, incisional hernia, skin breakdown, and others entailed on the consent form) and after a compliant diet and exercise program, the patient was scheduled for an elective laparoscopic sleeve gastrectomy.  Description of Operation:  Following informed consent, the patient was taken to the operating room and placed on the operating table in the supine position.  She had previously received prophylactic antibiotics and subcutaneous heparin for DVT prophylaxis in the pre-op holding area.  After induction of general endotracheal anesthesia by the anesthesiologist, the patient underwent placement of sequential compression devices, Foley catheter and an oro-gastric tube.  A timeout was confirmed by the surgery and anesthesia teams.  The patient was adequately padded at all pressure points and placed on a footboard to prevent slippage from the OR table during extremes of position during surgery.  She underwent a routine sterile prep and drape of her entire abdomen.    Next, A transverse incision was made under the left subcostal area and a 64mm optical viewing trocar was introduced into the peritoneal cavity.  Pneumoperitoneum was applied with a high flow and low pressure. A laparoscope was inserted to confirm placement. A extraperitoneal block was then placed at the lateral abdominal wall using exparel diluted with marcaine. 5 additional trocars were placed: 1 58mm trocar to the left of the midline. 1 additional 61mm trocar in the left lateral area, 1 62mm trocar in the right mid abdomen, and 1 44mm trocar in the right subcostal area.  The UGI showed a small hiatal hernia. Therefore, the pars flaccida was incised with harmonic scalpel. The stomach was reduced but on dissection of the posterior crus there was a visible hernia with small sac. The sac was dissected free and 1 0 ethibond sutures placed in interrupted fashion. A calibration tube was passed to ensure appropriate size of the hiatus.   The fat pad at the GE junction was incised and the gastrodiaphragmatic ligament was divided using the Harmonic scalpel. Next, a hole was created through the lesser omentum along the greater curve of the stomach to enter the lesser sac. The vessels along the greater omentum were  Then ligated and divided using the Harmonic scalpel moving towards the spleen and then short gastric vessels were ligated and divided in the same fashion to fully mobilize the fundus. The left crus was identified to ensure completion of the dissection. Next the antrum was measured and dissection continued inferiorly along the greater curve towards the pylorus and stopped 6cm from the pylorus.   A 40Fr ViSiGi dilator was placed into the esophgaus and along the lesser curve of the stomach and placed on suction. A 83mm 4-40mm tristapler was used to begin the resection along the antrum being sure to stay well away from the angularis by angling the jaws of the stapler towards the greater curve.  An additional 51mm 4-56mm tristapler was used to continue the dissection. Then multiple 69mm 3-75mm tristapler loads were used to complete the resection staying along  the [place] and ensuring the fundus was not retained by appropriately retracting it lateral. Air was inserted through the Ferrum to perform a leak test showing no bubbles and a neutral lie of the stomach.  The assistant then went and performed an upper endoscopy and leak test. No bubbles were seen and the sleeve and antrum distended appropriately. The specimen was then placed in an endocatch bag and removed by the 52mm port. The fascia of the 43mm port was closed with a 0 vicryl by suture passer. Hemostasis was ensured. Pneumoperitoneum was evacuated, all ports were removed and all incisions closed with 4-0 monocryl suture in subcuticular fashion. Glue was put in place for dressing. The patient awoke from anesthesia and was brought to pacu in stable condition. All counts were correct.  Estimated blood loss: <75ml  Specimens:  Sleeve gastrectomy  Post-Op Plan:       Pain Management: PO, prn      Antibiotics: Prophylactic      Anticoagulation: Prophylactic, Starting now      Post Op Studies/Consults: Not applicable      Intended Discharge: within 48h      Intended Outpatient Follow-Up: Two Week      Intended Outpatient Studies: Not Applicable      Other: Not Applicable   Arta Bruce Graciella Arment

## 2016-10-31 NOTE — Op Note (Signed)
Preoperative diagnosis: laparoscopic sleeve gastrectomy  Postoperative diagnosis: Same   Procedure: Upper endoscopy   Surgeon: Clovis Riley M.D.  Anesthesia: Gen.   Indications for procedure: This patient was undergoing a laparoscopic sleeve gastrectomy.   Description of procedure: The endoscopy was placed in the mouth and into the oropharynx and under endoscopic vision it was advanced to the esophagogastric junction. The pouch was insufflated and no bleeding or bubbles were seen. The GEJ was identified at 38cm from the teeth.  No bleeding or leaks were detected. The scope was withdrawn without difficulty.   Clovis Riley, M.D. General, Bariatric, & Minimally Invasive Surgery [facility]

## 2016-10-31 NOTE — Transfer of Care (Cosign Needed)
Immediate Anesthesia Transfer of Care Note  Patient: Monica Marks  Procedure(s) Performed: Procedure(s): LAPAROSCOPIC GASTRIC SLEEVE RESECTION WITH HIATAL HERNIA REPAIR, UPPER ENDO (N/A) UPPER GI ENDOSCOPY  Patient Location: PACU  Anesthesia Type:General  Level of Consciousness: awake, alert  and oriented  Airway & Oxygen Therapy: Patient Spontanous Breathing  Post-op Assessment: Report given to RN  Post vital signs: stable  Last Vitals:  Vitals:   10/31/16 0527 10/31/16 0904  BP: (!) 155/81 (!) (P) 165/93  Pulse: 87 72  Resp: 16 19  Temp: 36.8 C (P) 36.9 C    Last Pain:  Vitals:   10/31/16 0527  TempSrc: Oral      Patients Stated Pain Goal: 3 ([id number])  Complications: No apparent anesthesia complications

## 2016-10-31 NOTE — Discharge Instructions (Signed)

## 2016-10-31 NOTE — Anesthesia Postprocedure Evaluation (Addendum)
Anesthesia Post Note  Patient: Monica Marks  Procedure(s) Performed: Procedure(s) (LRB): LAPAROSCOPIC GASTRIC SLEEVE RESECTION WITH HIATAL HERNIA REPAIR, UPPER ENDO (N/A) UPPER GI ENDOSCOPY  Patient location during evaluation: PACU Anesthesia Type: General Level of consciousness: awake Pain management: pain level controlled Vital Signs Assessment: post-procedure vital signs reviewed and stable Cardiovascular status: stable Postop Assessment: no headache, no backache and no signs of nausea or vomiting Anesthetic complications: no        Last Vitals:  Vitals:   10/31/16 0945 10/31/16 1000  BP: (!) 148/81 (!) 151/87  Pulse: 72 69  Resp: 13 14  Temp:  36.8 C    Last Pain:  Vitals:   10/31/16 1005  TempSrc:   PainSc: 3    Pain Goal: Patients Stated Pain Goal: 3 (10/31/16 1005)               [name] JR,JOHN Mateo Flow

## 2016-10-31 NOTE — H&P (Signed)
Monica Marks is an 51 y.o. female.   Chief Complaint: obesity HPI: 51 yo female with morbid obesity and HOCM presents after completing all requirements and is ready to proceed with lap sleeve gastrectomy.  Past Medical History:  Diagnosis Date  . Anxiety   . Asthma    bring inhaler dos  . Fibroid   . Glaucoma   . Headache(784.0)   . HOCM (hypertrophic obstructive cardiomyopathy) ([place])   . Hypertension   . LVH (left ventricular hypertrophy)    TTE showed severe LVH with SAM & a resting LVOT gradient of 65mmHg which increased to 49mmHg with Valsalva   . Sleep apnea    cpap  . SOB (shortness of breath) on exertion    During moderate exercise    Past Surgical History:  Procedure Laterality Date  . ABDOMINAL HYSTERECTOMY  07/30/2012   Procedure: HYSTERECTOMY ABDOMINAL;  Surgeon: Bennetta Laos, MD;  Location: [facility];  Service: Gynecology;  Laterality: N/A;  . ABDOMINOPLASTY  2006  . BREAST SURGERY  2006   BREAST LIFT  . CESAREAN SECTION     3 TIMES  . COLONOSCOPY W/ POLYPECTOMY  05/27/2015   With biopsy  . LAPAROSCOPY  07/30/2012   Procedure: LAPAROSCOPY DIAGNOSTIC;  Surgeon: Bennetta Laos, MD;  Location: [facility];  Service: Gynecology;  Laterality: N/A;  attempted  . SALPINGOOPHORECTOMY  07/30/2012   Procedure: SALPINGO OOPHORECTOMY;  Surgeon: Bennetta Laos, MD;  Location: [facility];  Service: Gynecology;  Laterality: Right;    Family History  Problem Relation Age of Onset  . Hypertension Mother   . Cancer Mother   . Lung cancer Father   . Melanoma Father   . Heart disease Father     HCM, s/p ICD implant  . Stroke Other    Social History:  reports that she quit smoking about 8 years ago. Her smoking use included Cigarettes. She smoked 1.00 pack per day. She has never used smokeless tobacco. She reports that she drinks alcohol. She reports that she does not use drugs.  Allergies:  Allergies  Allergen Reactions  . Tape Itching and Rash    Plastic  tape/adhesives   Paper tape is ok    Medications Prior to Admission  Medication Sig Dispense Refill  . ALPRAZolam (XANAX) 0.25 MG tablet Take 1 tablet by mouth 2 (two) times daily as needed for anxiety.     . calcium-vitamin D (CALCIUM 500/D) 500-200 MG-UNIT tablet Take 1 tablet by mouth 3 (three) times daily.    Marland Kitchen escitalopram (LEXAPRO) 20 MG tablet Take 20 mg by mouth daily.    . hydrochlorothiazide (MICROZIDE) 12.5 MG capsule Take 12.5 mg by mouth daily.    Marland Kitchen latanoprost (XALATAN) 0.005 % ophthalmic solution Place 1 drop into both eyes at bedtime.    . metoprolol (LOPRESSOR) 50 MG tablet Take one and 1/2 tablets by mouth twice daily 45 tablet 11  . Multiple Vitamins-Minerals (WOMENS MULTI VITAMIN & MINERAL PO) Take 1 tablet by mouth daily.    Marland Kitchen omeprazole (PRILOSEC) 20 MG capsule Take 20 mg by mouth daily.  1  . VENTOLIN HFA 108 (90 BASE) MCG/ACT inhaler INHALE 1 TO 2 PUFFS EVERY 4 TO 6 HOURS IF NEEDED FOR WHEEZING  4    No results found for this or any previous visit (from the past 65 hour(s)). No results found.  Review of Systems  Constitutional: Negative for chills and fever.  HENT: Negative for hearing loss.   Eyes: Negative for  blurred vision and double vision.  Respiratory: Negative for cough and hemoptysis.   Cardiovascular: Negative for chest pain and palpitations.  Gastrointestinal: Negative for abdominal pain, nausea and vomiting.  Genitourinary: Negative for dysuria and urgency.  Musculoskeletal: Negative for myalgias and neck pain.  Skin: Negative for itching and rash.  Neurological: Negative for dizziness, tingling and headaches.  Endo/Heme/Allergies: Does not bruise/bleed easily.  Psychiatric/Behavioral: Negative for depression and suicidal ideas.    Blood pressure (!) 155/81, pulse 87, temperature 98.2 F (36.8 C), temperature source Oral, resp. rate 16, height 5\' 5"  (1.651 m), weight 101.3 kg (223 lb 4 oz), last menstrual period 07/30/2012, SpO2 98 %. Physical  Exam  Vitals reviewed. Constitutional: She is oriented to person, place, and time. She appears well-developed and well-nourished.  HENT:  Head: Normocephalic and atraumatic.  Eyes: Conjunctivae and EOM are normal. Pupils are equal, round, and reactive to light.  Neck: Normal range of motion. Neck supple.  Cardiovascular: Normal rate and regular rhythm.   Respiratory: Effort normal and breath sounds normal.  GI: Soft. Bowel sounds are normal. She exhibits no distension. There is no tenderness.  Musculoskeletal: Normal range of motion.  Neurological: She is alert and oriented to person, place, and time.  Skin: Skin is warm and dry.  Psychiatric: She has a normal mood and affect. Her behavior is normal.     Assessment/Plan 51 yo female with morbid obesity. -lap sleeve -restart lopressor immediately -1-2 night hospitalization  Mickeal Skinner, MD 10/31/2016, 7:04 AM

## 2016-10-31 NOTE — Anesthesia Procedure Notes (Signed)
Procedure Name: Intubation Date/Time: 10/31/2016 7:19 AM Performed by: Toron Bowring, Virgel Gess Pre-anesthesia Checklist: Patient identified, Emergency Drugs available, Suction available, Patient being monitored and Timeout performed Patient Re-evaluated:Patient Re-evaluated prior to inductionOxygen Delivery Method: Circle system utilized Preoxygenation: Pre-oxygenation with 100% oxygen Intubation Type: IV induction Ventilation: Mask ventilation without difficulty Laryngoscope Size: Miller and 2 Grade View: Grade I Tube type: Oral Tube size: 7.0 mm Number of attempts: 1 Airway Equipment and Method: Stylet Placement Confirmation: ETT inserted through vocal cords under direct vision,  positive ETCO2 and breath sounds checked- equal and bilateral Secured at: 21 cm Tube secured with: Tape Dental Injury: Teeth and Oropharynx as per pre-operative assessment  Comments: Intubated by Jackquline Bosch SRNA

## 2016-11-01 LAB — CBC WITH DIFFERENTIAL/PLATELET
BASOS ABS: 0 10*3/uL (ref 0.0–0.1)
Basophils Relative: 0 %
EOS ABS: 0 10*3/uL (ref 0.0–0.7)
Eosinophils Relative: 0 %
HCT: 36.1 % (ref 36.0–46.0)
HEMOGLOBIN: 12.1 g/dL (ref 12.0–15.0)
LYMPHS ABS: 1 10*3/uL (ref 0.7–4.0)
Lymphocytes Relative: 7 %
MCH: 30.7 pg (ref 26.0–34.0)
MCHC: 33.5 g/dL (ref 30.0–36.0)
MCV: 91.6 fL (ref 78.0–100.0)
Monocytes Absolute: 1.3 10*3/uL — ABNORMAL HIGH (ref 0.1–1.0)
Monocytes Relative: 9 %
NEUTROS PCT: 84 %
Neutro Abs: 12.5 10*3/uL — ABNORMAL HIGH (ref 1.7–7.7)
Platelets: 244 10*3/uL (ref 150–400)
RBC: 3.94 MIL/uL (ref 3.87–5.11)
RDW: 12.8 % (ref 11.5–15.5)
WBC: 14.9 10*3/uL — AB (ref 4.0–10.5)

## 2016-11-01 MED ORDER — OMEPRAZOLE 20 MG PO CPDR: 20.0000 mg | DELAYED_RELEASE_CAPSULE | Freq: Every day | ORAL | 1 refills | Status: AC

## 2016-11-01 NOTE — Progress Notes (Signed)
Pt's vitals were WNL, tolerating water and shakes and pain was under control. Discussed discharge instructions with both patient and husband. Discharged to home with prescriptions.

## 2016-11-01 NOTE — Progress Notes (Signed)
Patient alert and oriented, pain is controlled. Patient is tolerating fluids, advanced to protein shake today, patient is tolerating well.  Reviewed Gastric sleeve discharge instructions with patient and patient is able to articulate understanding.  Provided information on BELT program, Support Group and [facility] outpatient pharmacy. All questions answered, will continue to monitor.  

## 2016-11-01 NOTE — Progress Notes (Signed)
  Progress Note: Metabolic and Bariatric Surgery Service   Subjective: Tolerated >462ml water overnight, started on shake this morning.  Objective: Vital signs in last 24 hours: Temp:  [97.7 F (36.5 C)-98.9 F (37.2 C)] 98.9 F (37.2 C) (02/20 0523) Pulse Rate:  [64-87] 87 (02/20 0523) Resp:  [13-19] 18 (02/20 0523) BP: (123-165)/(74-94) 159/84 (02/20 0523) SpO2:  [93 %-100 %] 93 % (02/20 0523) Last BM Date: 10/31/16  Intake/Output from previous day: 02/19 0701 - 02/20 0700 In: 3243.8 [P.O.:240; I.V.:3003.8] Out: 1025 [Urine:1000; Blood:25] Intake/Output this shift: No intake/output data recorded.  Lungs: CTAB  Cardiovascular: RRR  Abd: soft, NT, ND, incisions bandaged with minimal seep through  Extremities: no edema  Neuro: AOx4  Lab Results: CBC   Recent Labs  10/31/16 1047 11/01/16 0453  WBC 12.0* 14.9*  HGB 13.2 12.1  HCT 39.2 36.1  PLT 223 244   BMET  Recent Labs  10/31/16 1047  CREATININE 0.88   PT/INR No results for input(s): LABPROT, INR in the last 72 hours. ABG No results for input(s): PHART, HCO3 in the last 72 hours.  Invalid input(s): PCO2, PO2  Studies/Results:  Anti-infectives: Anti-infectives    Start     Dose/Rate Route Frequency Ordered Stop   10/31/16 0531  cefoTEtan in Dextrose 5% (CEFOTAN) IVPB 2 g  Status:  Discontinued     2 g Intravenous On call to O.R. 10/31/16 0531 10/31/16 1004      Medications: Scheduled Meds: . chlorhexidine  15 mL Mouth Rinse BID  . enoxaparin (LOVENOX) injection  30 mg Subcutaneous Q12H  . mouth rinse  15 mL Mouth Rinse q12n4p  . metoprolol  25 mg Oral BID  . pantoprazole (PROTONIX) IV  40 mg Intravenous QHS  . protein supplement shake  2 oz Oral Q2H   Continuous Infusions: . dextrose 5 % and 0.45% NaCl 75 mL/hr at 10/31/16 2208   PRN Meds:.oxyCODONE **AND** acetaminophen, acetaminophen, albuterol, ALPRAZolam, morphine injection, ondansetron (ZOFRAN) IV  Assessment/Plan: Patient  Active Problem List   Diagnosis Date Noted  . Obesity 10/31/2016  . GI bleed 06/09/2015  . Acute blood loss anemia 06/09/2015  . Asthma 06/09/2015  . Dyspnea 08/12/2014  . Palpitations 08/12/2014  . Hypertrophic obstructive cardiomyopathy (HOCM) ([place]) 08/28/2013  . Essential hypertension 08/28/2013  . Endometriosis 08/23/2012   s/p Procedure(s): LAPAROSCOPIC GASTRIC SLEEVE RESECTION WITH HIATAL HERNIA REPAIR, UPPER ENDO UPPER GI ENDOSCOPY 10/31/2016  Doing well, labs look fine (slight WBC elevation to 14.7), ok to advance to POD 1 diet Disposition:  LOS: 1 day  The patient should be discharged from the hospital today  Mickeal Skinner, MD [phone] [facility]

## 2016-11-01 NOTE — Discharge Summary (Signed)
Physician Discharge Summary  Monica Marks [id number] DOB: 06-15-1966 DOA: 10/31/2016  PCP: London Pepper, MD  Admit date: 10/31/2016 Discharge date: 11/01/2016  Recommendations for Outpatient Follow-up:  1.  (include homehealth, outpatient follow-up instructions, specific recommendations for PCP to follow-up on, etc.)  Follow-up Information    Mickeal Skinner, MD Follow up on 11/17/2016.   Specialty:  General Surgery Why:  @ 1100am Contact information: [address] [place] [address] [phone]        Mickeal Skinner, MD Follow up.   Specialty:  General Surgery Contact information: [place] [address] [phone]          Discharge Diagnoses:  Active Problems:   Obesity   Surgical Procedure: Laparoscopic Sleeve Gastrectomy, upper endoscopy  Discharge Condition: Good Disposition: Home  Diet recommendation: Postoperative sleeve gastrectomy diet (liquids only)  Filed Weights   10/31/16 0527  Weight: 101.3 kg (223 lb 4 oz)     Hospital Course:  The patient was admitted after undergoing laparoscopic sleeve gastrectomy. POD 0 she ambulated well. POD 1 she was started on the water diet protocol and tolerated 300 ml in the first shift. Once meeting the water amount she was advanced to bariatric protein shakes which they tolerated and were discharged home POD 1.  Treatments: surgery: laparoscopic sleeve gastrectomy  Discharge Instructions  Discharge Instructions    Ambulate hourly while awake    Complete by:  As directed    Call MD for:  difficulty breathing, headache or visual disturbances    Complete by:  As directed    Call MD for:  persistant dizziness or light-headedness    Complete by:  As directed    Call MD for:  persistant nausea and vomiting    Complete by:  As directed    Call MD for:  redness, tenderness, or signs of infection (pain, swelling, redness, odor or green/yellow discharge around  incision site)    Complete by:  As directed    Call MD for:  severe uncontrolled pain    Complete by:  As directed    Call MD for:  temperature >101 F    Complete by:  As directed    Diet bariatric full liquid    Complete by:  As directed    Discharge wound care:    Complete by:  As directed    Remove Bandaids tomorrow, ok to shower tomorrow. Steristrips may fall off in 1-3 weeks.   Incentive spirometry    Complete by:  As directed    Perform hourly while awake     Allergies as of 11/01/2016      Reactions   Tape Itching, Rash   Plastic tape/adhesives  Paper tape is ok      Medication List    TAKE these medications   ALPRAZolam 0.25 MG tablet Commonly known as:  XANAX Take 1 tablet by mouth 2 (two) times daily as needed for anxiety.   CALCIUM 500/D 500-200 MG-UNIT tablet Generic drug:  calcium-vitamin D Take 1 tablet by mouth 3 (three) times daily.   escitalopram 20 MG tablet Commonly known as:  LEXAPRO Take 20 mg by mouth daily.   hydrochlorothiazide 12.5 MG capsule Commonly known as:  MICROZIDE Take 12.5 mg by mouth daily. Notes to patient:  Monitor Blood Pressure Daily and keep a log for primary care physician.  Monitor for symptoms of dehydration.  You may need to make changes to your medications with  rapid weight loss.     latanoprost 0.005 % ophthalmic solution Commonly known as:  XALATAN Place 1 drop into both eyes at bedtime.   metoprolol 50 MG tablet Commonly known as:  LOPRESSOR Take one and 1/2 tablets by mouth twice daily Notes to patient:  Monitor Blood Pressure Daily and keep a log for primary care physician.  Monitor for symptoms of dehydration.  You may need to make changes to your medications with rapid weight loss.     omeprazole 20 MG capsule Commonly known as:  PRILOSEC Take 1 capsule (20 mg total) by mouth daily.   VENTOLIN HFA 108 (90 Base) MCG/ACT inhaler Generic drug:  albuterol INHALE 1 TO 2 PUFFS EVERY 4 TO 6 HOURS IF NEEDED FOR  WHEEZING   WOMENS MULTI VITAMIN & MINERAL PO Take 1 tablet by mouth daily.      Follow-up Information    Mickeal Skinner, MD Follow up on 11/17/2016.   Specialty:  General Surgery Why:  @ 1100am Contact information: [address] [place] [address] [phone]        Mickeal Skinner, MD Follow up.   Specialty:  General Surgery Contact information: [place] [address] [phone]            The results of significant diagnostics from this hospitalization (including imaging, microbiology, ancillary and laboratory) are listed below for reference.    Significant Diagnostic Studies: No results found.  Labs: Basic Metabolic Panel:  Recent Labs Lab 10/27/16 1036 10/31/16 1047  NA 137  --   K 3.6  --   CL 101  --   CO2 28  --   GLUCOSE 102*  --   BUN 17  --   CREATININE 0.68 0.88  CALCIUM 9.5  --    Liver Function Tests:  Recent Labs Lab 10/27/16 1036  AST 28  ALT 42  ALKPHOS 83  BILITOT 0.9  PROT 7.3  ALBUMIN 4.4    CBC:  Recent Labs Lab 10/27/16 1036 10/31/16 1047 11/01/16 0453  WBC 8.0 12.0* 14.9*  NEUTROABS 6.0  --  12.5*  HGB 13.5 13.2 12.1  HCT 39.1 39.2 36.1  MCV 89.7 89.3 91.6  PLT 230 223 244    CBG: No results for input(s): GLUCAP in the last 168 hours.  Active Problems:   Obesity   Time coordinating discharge: <2min

## 2016-11-01 NOTE — Progress Notes (Signed)
Pt has consumed over 12 oz of water. Vanilla premier protein shake given to pt with instructions to sip slowly and stop if feeling bloated, nauseous, or full.  Will continue to monitor.  Monica Marks

## 2016-11-10 ENCOUNTER — Telehealth (HOSPITAL_COMMUNITY): Payer: Self-pay

## 2016-11-10 NOTE — Telephone Encounter (Signed)
Made discharge phone call to patient asking the following questions.    1. Do you have someone to care for you now that you are home?  Independent back at work 2. Are you having pain now that is not relieved by your pain medication?  No need for pain medications 3. Are you able to drink the recommended daily amount of fluids (48 ounces minimum/day) and protein (60-80 grams/day) as prescribed by the dietitian or nutritional counselor?  Reports more than 63 ounces of fluid, 90+grams of protein ( 3 shakes and yogurt) 4. Are you taking the vitamins and minerals as prescribed?  No trouble with vitamins 5. Do you have the "on call" number to contact your surgeon if you have a problem or question?  yes 6. Are your incisions free of redness, swelling or drainage? (If steri strips, address that these can fall off, shower as tolerated) incisions look good 7. Have your bowels moved since your surgery?  If not, are you passing gas?  yes 8. Are you up and walking 3-4 times per day?  yes 9. Were you provided your discharge medications before your surgery or before you were discharged from the hospital and are you taking them without problem?  No problems with medications at all

## 2016-11-15 ENCOUNTER — Encounter: Payer: Self-pay | Admitting: Skilled Nursing Facility1

## 2016-11-15 ENCOUNTER — Encounter: Payer: BC Managed Care – PPO | Attending: General Surgery | Admitting: Skilled Nursing Facility1

## 2016-11-15 DIAGNOSIS — Z713 Dietary counseling and surveillance: Secondary | ICD-10-CM | POA: Insufficient documentation

## 2016-11-15 DIAGNOSIS — E669 Obesity, unspecified: Secondary | ICD-10-CM

## 2016-11-15 NOTE — Progress Notes (Signed)
Bariatric Class:  Appt start time: 1530 end time:  1630.  2 Week Post-Operative Nutrition Class  Patient was seen on 11/15/2016 for Post-[facility]ative Nutrition [facility]  Pt states she has been experiencing some lightheadedness and states her blood pressure medicine will be changed. Surgery date: 10/31/2016 Surgery type: sleeve gastrectomy [facility] weight at [facility]: 233 lbs on 09/19/2016 Weight today: 215.4 lbs Weight Change: 14  TANITA  BODY COMP RESULTS  10/12/16 11/15/2016   BMI (kg/m^2) 38.8 35.8   Fat Mass (lbs) 115.2 105.6   Fat Free Mass (lbs) 114.2 109.8   Total Body Water (lbs) 83.2 79.4   The following the learning objectives were met by the patient during this course:  Identifies Phase 3A (Soft, High Proteins) Dietary Goals and will begin from 2 weeks post-operatively to 2 months post-operatively  Identifies appropriate sources of fluids and proteins   States protein recommendations and appropriate sources post-operatively  Identifies the need for appropriate texture modifications, mastication, and bite sizes when consuming solids  Identifies appropriate multivitamin and calcium sources post-operatively  Describes the need for physical activity post-operatively and will follow MD recommendations  States when to call healthcare provider regarding medication questions or post-operative complications  Handouts given during class include:  Phase 3A: Soft, High Protein Diet Handout  Follow-Up Plan: Patient will follow-up at [facility] in 6 weeks for 2 month post-op nutrition visit for diet advancement per MD.

## 2016-12-28 ENCOUNTER — Encounter: Payer: Self-pay | Admitting: Skilled Nursing Facility1

## 2016-12-28 ENCOUNTER — Encounter: Payer: BC Managed Care – PPO | Attending: General Surgery | Admitting: Skilled Nursing Facility1

## 2016-12-28 DIAGNOSIS — E669 Obesity, unspecified: Secondary | ICD-10-CM

## 2016-12-28 DIAGNOSIS — Z713 Dietary counseling and surveillance: Secondary | ICD-10-CM | POA: Insufficient documentation

## 2016-12-28 NOTE — Progress Notes (Signed)
Follow-up visit:  8 Weeks Post-Operative sleeve gastrectomy Surgery  Medical Nutrition Therapy:  Appt start time: 10:30end time: 10:49  Primary concerns today: Post-operative Bariatric Surgery Nutrition Management.  Pt states she has trouble with chicken. Pt states she has not been craving any sugar. Pt states she no longer struggles with Asthma and no longer has back pain.Pt states she may be struggling getting enough food in and does not have an appetite but still forces herself to eat.   Surgery date: 10/31/2016 Surgery type: sleeve gastrectomy [facility]: 233 lbs on 09/19/2016 Weight today: 199.4 lbs Weight Change: 16  TANITA  BODY COMP RESULTS  10/12/16 11/15/2016 12/28/2016   BMI (kg/m^2) 38.8 35.8 33.2   Fat Mass (lbs) 115.2 105.6 95.2   Fat Free Mass (lbs) 114.2 109.8 104.2   Total Body Water (lbs) 83.2 79.4 75    24-hr recall: B (AM): protein shake (30g) Snk (AM): 5 almonds and cheese (10g) L (PM): hamburger patty (7g)  Snk (PM):  D (PM): meatball with cheese (14g) Snk (PM):   Fluid intake: 11 ounces, water: 90+ ounces Estimated total protein intake: 61g  Medications: lowered blood pressure medication Supplementation: bariatric advantage and calcium  Using straws: no Drinking while eating: no Having you been chewing well:no Chewing/swallowing difficulties: no Changes in vision: no Changes to mood/headaches: no Hair loss/Cahnges to skin/Changes to nails: no Any difficulty focusing or concentrating: no Sweating: no Dizziness/Lightheaded: no Palpitations: no  Carbonated beverages: no N/V/D/C/GAS: NO: taking miralax  Abdominal Pain: no Dumping syndrome: no  Recent physical activity:  refinishing furniture, walking 3 days a week for 30 minutes   Progress Towards Goal(s):  In progress.   Nutritional Diagnosis:  [place]-3.3 Overweight/obesity related to past poor dietary habits and physical inactivity as evidenced by patient w/ recent sleeve gastrectomy  surgery following dietary guidelines for continued weight loss.  Intervention:  Nutrition counseling. Dietitian educated the pt on advancing her diet to include non starchy vegetables.  Teaching Method Utilized:  Visual Auditory Hands on  Barriers to learning/adherence to lifestyle change: lack of appetite   Demonstrated degree of understanding via:  Teach Back   Monitoring/Evaluation:  Dietary intake, exercise, lap band fills, and body weight. Follow up in 2 months

## 2016-12-29 ENCOUNTER — Other Ambulatory Visit: Payer: Self-pay | Admitting: Internal Medicine

## 2017-01-27 ENCOUNTER — Other Ambulatory Visit: Payer: Self-pay | Admitting: Internal Medicine

## 2017-02-17 NOTE — Addendum Note (Signed)
Addendum  created 02/17/17 1020 by Lyn Hollingshead, MD   Sign clinical note

## 2017-02-27 ENCOUNTER — Encounter: Payer: Self-pay | Admitting: Skilled Nursing Facility1

## 2017-02-27 ENCOUNTER — Encounter: Payer: BC Managed Care – PPO | Attending: General Surgery | Admitting: Skilled Nursing Facility1

## 2017-02-27 DIAGNOSIS — Z6841 Body Mass Index (BMI) 40.0 and over, adult: Secondary | ICD-10-CM

## 2017-02-27 DIAGNOSIS — Z713 Dietary counseling and surveillance: Secondary | ICD-10-CM | POA: Diagnosis present

## 2017-02-27 NOTE — Progress Notes (Signed)
Follow-up visit:  8 Weeks Post-Operative sleeve gastrectomy Surgery  Medical Nutrition Therapy:  Appt start time: 10:30end time: 10:49  Primary concerns today: Post-operative Bariatric Surgery Nutrition Management.  Pt is doing wonderful with no complaints.   Surgery date: 10/31/2016 Surgery type: sleeve gastrectomy Start weight at [facility]: 233 lbs on 09/19/2016 Weight today: 188.4 lbs Weight Change: 10  TANITA  BODY COMP RESULTS  10/12/16 11/15/2016 12/28/2016 02/27/2017   BMI (kg/m^2) 38.8 35.8 33.2 31.4   Fat Mass (lbs) 115.2 105.6 95.2 82   Fat Free Mass (lbs) 114.2 109.8 104.2 106.4   Total Body Water (lbs) 83.2 79.4 75 76    24-hr recall: B (AM): protein shake (30g) OR greek yogurt Snk (AM): 5 almonds and cheese (10g) L (PM): salad with chicken or shrimp (7g)  Snk (3PM): cheese and almonds with vegetables (brussl sprouts or broccoli) D (PM): chicken (14g) Snk (PM):   Fluid intake: 11 ounces, water: 100+ ounces Estimated total protein intake: 61g  Medications: lowered blood pressure medication Supplementation: bariatric advantage and calcium  Using straws: no Drinking while eating: no Having you been chewing well:no Chewing/swallowing difficulties: no Changes in vision: no Changes to mood/headaches: no Hair loss/Cahnges to skin/Changes to nails: no Any difficulty focusing or concentrating: no Sweating: no Dizziness/Lightheaded: no Palpitations: no  Carbonated beverages: no N/V/D/C/GAS: NO: taking miralax  Abdominal Pain: no Dumping syndrome: no  Recent physical activity:  refinishing furniture, walking 3 days a week for 30 minutes   Progress Towards Goal(s):  In progress.   Nutritional Diagnosis:  [place]-3.3 Overweight/obesity related to past poor dietary habits and physical inactivity as evidenced by patient w/ recent sleeve gastrectomy surgery following dietary guidelines for continued weight loss.  Intervention:  Nutrition counseling. Dietitian educated the pt  on advancing her diet to include non starchy vegetables.  Teaching Method Utilized:  Visual Auditory Hands on  Barriers to learning/adherence to lifestyle change: lack of appetite   Demonstrated degree of understanding via:  Teach Back   Monitoring/Evaluation:  Dietary intake, exercise, lap band fills, and body weight. Follow up in 2 months

## 2017-10-06 ENCOUNTER — Other Ambulatory Visit: Payer: Self-pay | Admitting: Internal Medicine

## 2017-11-05 NOTE — Progress Notes (Signed)
Cardiology Office Note Date:  11/06/2017  Patient ID:  Monica Marks, Monica Marks [birth year], MRN [id number] PCP:  London Pepper, MD  Electrophysiologist:  Dr. Lovena Le   Chief Complaint: 2 year f/u  History of Present Illness: Monica Marks is a 52 y.o. female with history of HOCM w/o syncope, historical holters have not noted any VT, HTN, obesity s/p gastric sleeve 10/2016.  She comes today to be seen for dr. Lovena Le, for a routine 2 year visit.  She last saw him Feb 2017, at that time doing well, tolerating BB, no symptoms of fluid OL or syncope.  No changes were made to her therapy.  Mentioned BP on high side, consideration of changing her BB to verapamil, though elected not to 2/2 HR being on slower side.  She is doing very well.  No dizziness, near syncope or syncope, no CP, palpitations or SOB.  She had bariatric surgery last year has lost >60lbs.  She walks on the treadmill for exercise and tolerates this level of exercise well.  She is taking sudafed for sinus congestion currently, reports that she has had a  Couple sinus infections on the last month pr so with the changing weather, allergies are acting up on her.  She Checks her BP at home, reports generally 110-120/70's    Past Medical History:  Diagnosis Date  . Anxiety   . Asthma    bring inhaler dos  . Fibroid   . Glaucoma   . Headache(784.0)   . HOCM (hypertrophic obstructive cardiomyopathy) ([place])   . Hypertension   . LVH (left ventricular hypertrophy)    TTE showed severe LVH with SAM & a resting LVOT gradient of 83mmHg which increased to 70mmHg with Valsalva   . Sleep apnea    cpap  . SOB (shortness of breath) on exertion    During moderate exercise    Past Surgical History:  Procedure Laterality Date  . ABDOMINAL HYSTERECTOMY  07/30/2012   Procedure: HYSTERECTOMY ABDOMINAL;  Surgeon: Bennetta Laos, MD;  Location: [facility];  Service: Gynecology;  Laterality: N/A;  . ABDOMINOPLASTY  2006  . BREAST SURGERY  2006   BREAST LIFT  . CESAREAN SECTION     3 TIMES  . COLONOSCOPY W/ POLYPECTOMY  05/27/2015   With biopsy  . LAPAROSCOPIC GASTRIC SLEEVE RESECTION WITH HIATAL HERNIA REPAIR N/A 10/31/2016   Procedure: LAPAROSCOPIC GASTRIC SLEEVE RESECTION WITH HIATAL HERNIA REPAIR, UPPER ENDO;  Surgeon: Arta Bruce Kinsinger, MD;  Location: [facility];  Service: General;  Laterality: N/A;  . LAPAROSCOPY  07/30/2012   Procedure: LAPAROSCOPY DIAGNOSTIC;  Surgeon: Bennetta Laos, MD;  Location: [facility];  Service: Gynecology;  Laterality: N/A;  attempted  . SALPINGOOPHORECTOMY  07/30/2012   Procedure: SALPINGO OOPHORECTOMY;  Surgeon: Bennetta Laos, MD;  Location: [facility];  Service: Gynecology;  Laterality: Right;  . UPPER GI ENDOSCOPY  10/31/2016   Procedure: UPPER GI ENDOSCOPY;  Surgeon: Arta Bruce Kinsinger, MD;  Location: [facility];  Service: General;;    Current Outpatient Medications  Medication Sig Dispense Refill  . ALPRAZolam (XANAX) 0.25 MG tablet Take 1 tablet by mouth 2 (two) times daily as needed for anxiety.     . calcium-vitamin D (CALCIUM 500/D) 500-200 MG-UNIT tablet Take 1 tablet by mouth 3 (three) times daily.    Marland Kitchen escitalopram (LEXAPRO) 20 MG tablet Take 20 mg by mouth daily.    Marland Kitchen latanoprost (XALATAN) 0.005 % ophthalmic solution Place 1 drop into both eyes at bedtime.    Marland Kitchen  metoprolol tartrate (LOPRESSOR) 50 MG tablet TAKE 1 AND 1/2 TABLETS BY MOUTH TWICE DAILY 90 tablet 8  . Multiple Vitamins-Minerals (WOMENS MULTI VITAMIN & MINERAL PO) Take 1 tablet by mouth daily.    Marland Kitchen omeprazole (PRILOSEC) 20 MG capsule Take 1 capsule (20 mg total) by mouth daily. 90 capsule 1  . VENTOLIN HFA 108 (90 BASE) MCG/ACT inhaler INHALE 1 TO 2 PUFFS EVERY 4 TO 6 HOURS IF NEEDED FOR WHEEZING  4   No current facility-administered medications for this visit.     Allergies:   Tape   Social History:  The patient  reports that she quit smoking about 9 years ago. Her smoking use included cigarettes. She smoked 1.00 pack per  day. she has never used smokeless tobacco. She reports that she drinks alcohol. She reports that she does not use drugs.   Family History:  The patient's family history includes Cancer in her mother; Heart disease in her father; Hypertension in her mother; Lung cancer in her father; Melanoma in her father; Stroke in her other.  ROS:  Please see the history of present illness.  All other systems are reviewed and otherwise negative.   PHYSICAL EXAM:  VS:  BP (!) 154/98   Pulse 62   Ht 5\' 5"  (1.651 m)   Wt 176 lb 9.6 oz (80.1 kg)   LMP 07/30/2012   BMI 29.39 kg/m  BMI: Body mass index is 29.39 kg/m. Well nourished, well developed, in no acute distress  HEENT: normocephalic, atraumatic  Neck: no JVD, carotid bruits or masses Cardiac: RRR; soft SM, no rubs, or gallops Lungs:  CTA b/l, no wheezing, rhonchi or rales  Abd: soft, nontender MS: no deformity or atrophy Ext:  no edema  Skin: warm and dry, no rash Neuro:  No gross deficits appreciated Psych: euthymic mood, full affect   EKG:  Done today and reviewed by myself SR, 62bpm, appears similar to previous  08/20/13: TTE Study Conclusions - Left ventricle: There appears to be some Chordal SAM with turbulence in the LVOT with a resting LVOT gradient of 42mmHg which increased to 77mmHg with Valsalva The cavity size was normal. There was mild concentric and severe asymmetric hypertrophy, consistent with hypertrophic cardiomyopathy. Systolic function was normal. The estimated ejection fraction was in the range of 60% to 65%. Wall motion was normal; there were no regional wall motion abnormalities. There was an increased relative contribution of atrial contraction to ventricular filling. Doppler parameters are consistent with abnormal left ventricular relaxation (grade 1 diastolic dysfunction). - Left atrium: The atrium was mildly dilated. - Atrial septum: A patent foramen ovale cannot be excluded. -  Pericardium, extracardiac: There was a left pleural effusion. Recommendations: Consider study if clinically indicated with agitated saline contrast administration.  Recent Labs: No results found for requested labs within last 8760 hours.  No results found for requested labs within last 8760 hours.   CrCl cannot be calculated (Patient's most recent lab result is older than the maximum 21 days allowed.).   Wt Readings from Last 3 Encounters:  11/06/17 176 lb 9.6 oz (80.1 kg)  02/27/17 188 lb 6.4 oz (85.5 kg)  12/28/16 199 lb 6.4 oz (90.4 kg)     Other studies reviewed: Additional studies/records reviewed today include: summarized above  ASSESSMENT AND PLAN:  1. HOCM     Asymptomatic     Discussed activity recommendations, avoiding strenuous activities  2. HTN     High today, she reports very unusual for her and  is taking Sudafed which may be contributing     Discussed choosing OTC cough/cold congestion remedies that her safe for people with HTN     She will keep an eye on her BP, notify us if routinely 130/80 or higher  3. Obesity     Now s/p bariatric surgery last year     Significant weight loss   Disposition: F/u with every other year as she has been.      Current medicines are reviewed at length with the patient today.  The patient did not have any concerns regarding medicines.  Venetia Night, PA-C 11/06/2017 3:03 PM     [place] [address] [phone] (office)  [phone] (fax)

## 2017-11-06 ENCOUNTER — Encounter: Payer: Self-pay | Admitting: Physician Assistant

## 2017-11-06 ENCOUNTER — Ambulatory Visit: Payer: BC Managed Care – PPO | Admitting: Physician Assistant

## 2017-11-06 VITALS — BP 154/98 | HR 62 | Ht 65.0 in | Wt 176.6 lb

## 2017-11-06 DIAGNOSIS — I10 Essential (primary) hypertension: Secondary | ICD-10-CM | POA: Diagnosis not present

## 2017-11-06 DIAGNOSIS — I421 Obstructive hypertrophic cardiomyopathy: Secondary | ICD-10-CM

## 2017-11-06 MED ORDER — METOPROLOL TARTRATE 50 MG PO TABS: 75.0000 mg | ORAL_TABLET | Freq: Two times a day (BID) | ORAL | 11 refills | Status: DC

## 2017-11-06 NOTE — Patient Instructions (Signed)
Medication Instructions:    Your physician recommends that you continue on your current medications as directed. Please refer to the Current Medication list given to you today.   If you need a refill on your cardiac medications before your next appointment, please call your pharmacy.  Labwork:  NONE ORDERED  TODAY    Testing/Procedures: NONE ORDERED  TODAY    Follow-Up:  Your physician wants you to follow-up in:  [place] will receive a reminder letter in the mail two months in advance. If you don't receive a letter, please call our office to schedule the follow-up appointment.     Any Other Special Instructions Will Be Listed Below (If Applicable).

## 2018-02-01 IMAGING — RF DG UGI W/ KUB
12 series · 18 of 24 positions shown · non-contrast
Comparison: None.

CLINICAL DATA: Preoperative for sleeve gastrectomy

EXAM:
UPPER GI SERIES WITH KUB
TECHNIQUE: After obtaining a scout radiograph a routine upper GI series was
performed using thin barium
FLUOROSCOPY TIME:  Fluoroscopy Time:  2 minutes, 6 seconds
Radiation Exposure Index (if provided by the fluoroscopic device):
37.6
Number of Acquired Spot Images: None

[Series 1: t abdomen supine · 0.15mm/px · 1 of 1 slices shown]
[im 1/1]
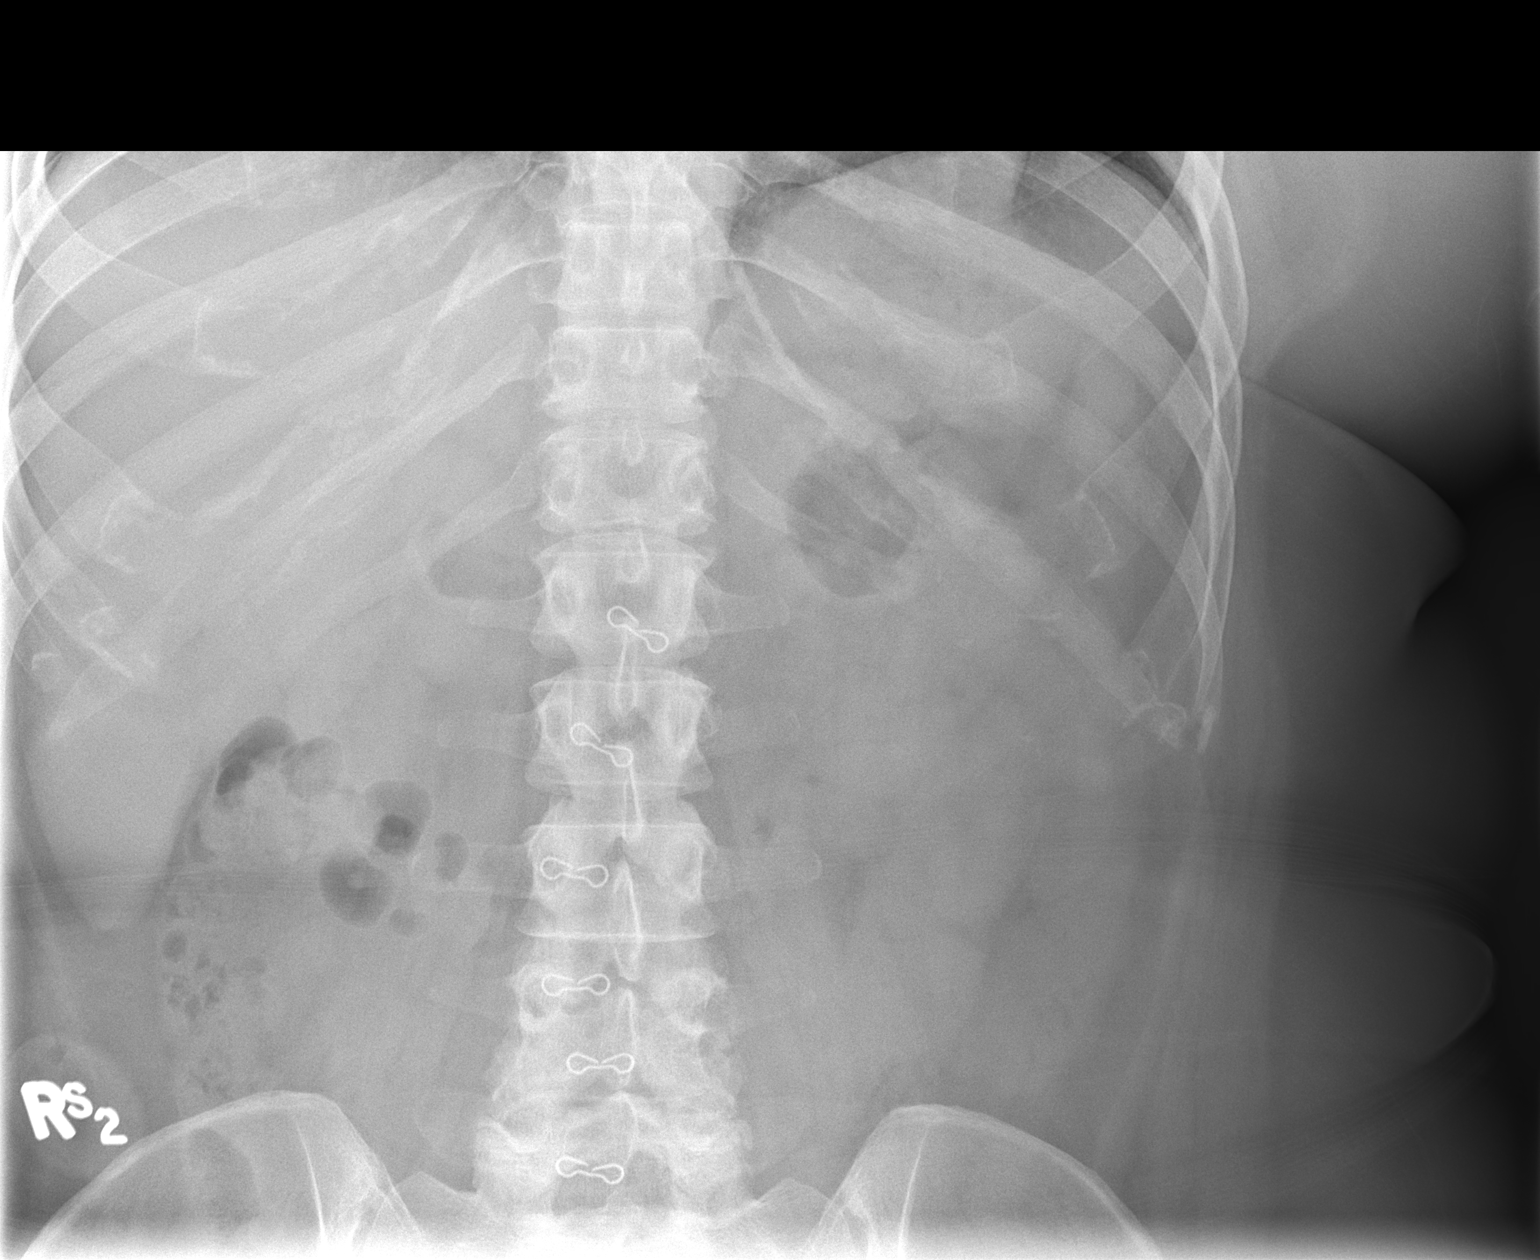

[Series 2: cp_standard · 0.34mm/px · 2 of 40 frames shown (1 of 11)]
[frame 7/40]
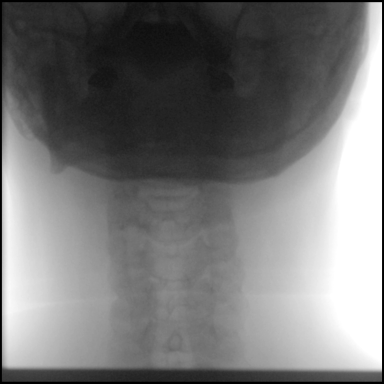
[frame 35/40]
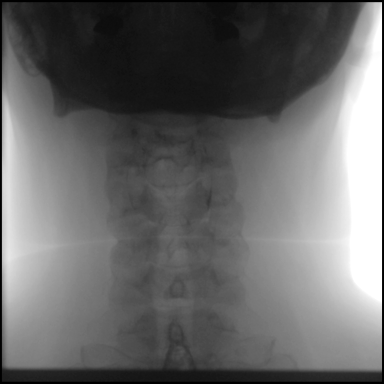

[Series 3: cp_standard · 0.34mm/px · 2 of 32 frames shown (2 of 11)]
[frame 17/32]
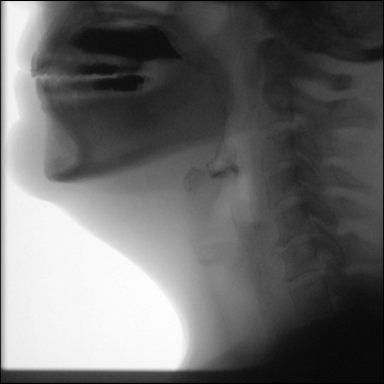
[frame 32/32]
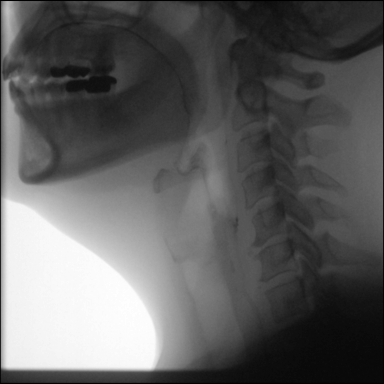

[Series 4: cp_standard · 0.34mm/px · 2 of 85 frames shown (3 of 11)]
[frame 13/85]
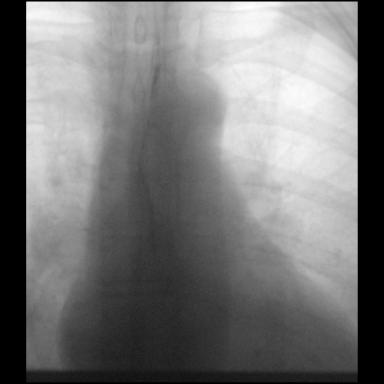
[frame 43/85]
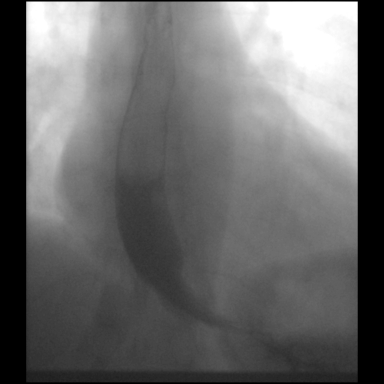

[Series 5: cp_standard · 0.36mm/px · 2 of 51 frames shown (4 of 11)]
[frame 8/51]
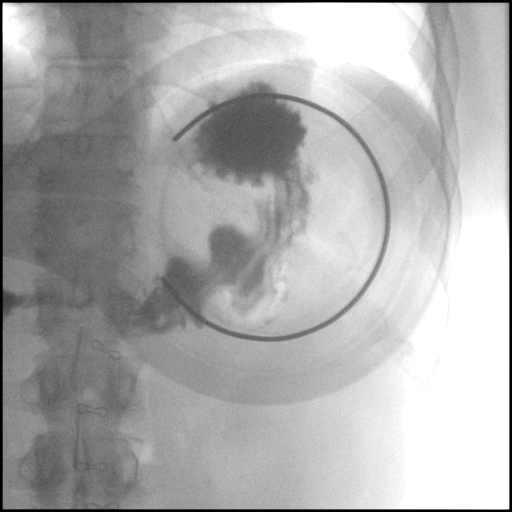
[frame 28/51]
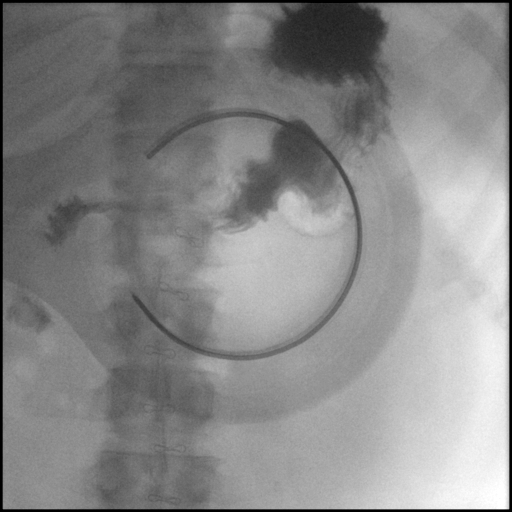

[Series 6: cp_standard · 0.35mm/px · 2 of 40 frames shown (5 of 11)]
[frame 4/40]
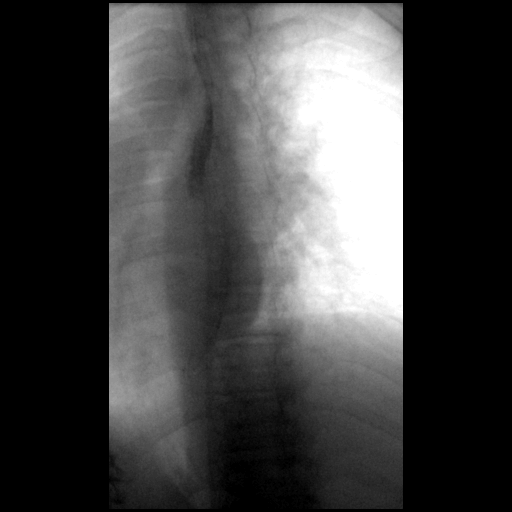
[frame 21/40]
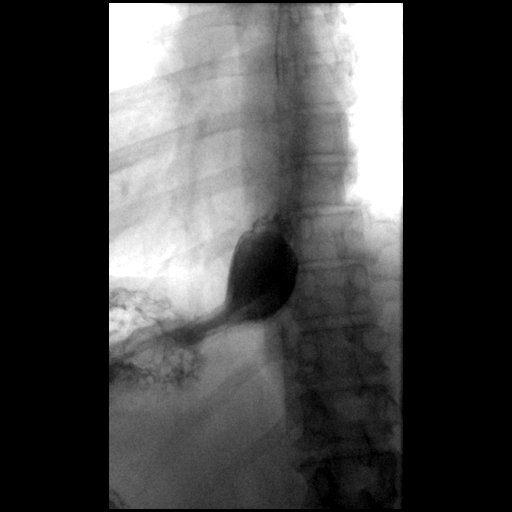

[Series 7: cp_standard · 0.36mm/px · 2 of 39 frames shown (6 of 11)]
[frame 6/39]
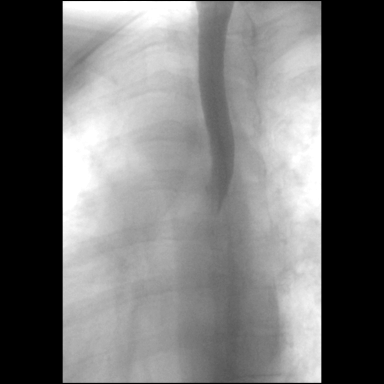
[frame 34/39]
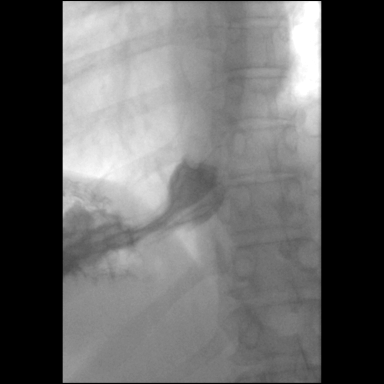

[Series 8: cp_standard · 0.36mm/px · 1 of 34 frames shown (7 of 11)]
[frame 29/34]
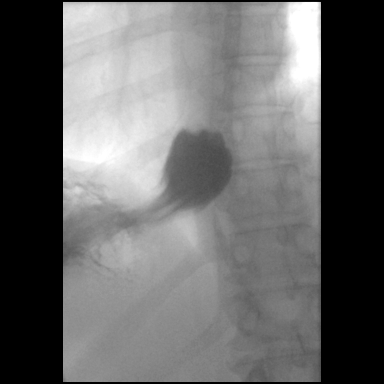

[Series 9: cp_standard · 0.36mm/px · 1 of 36 frames shown (8 of 11)]
[frame 31/36]
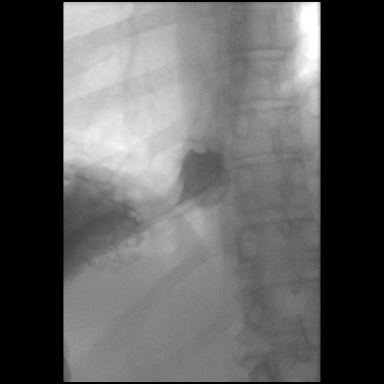

[Series 10: cp_standard · 0.36mm/px · 1 of 27 frames shown (9 of 11)]
[frame 10/27]
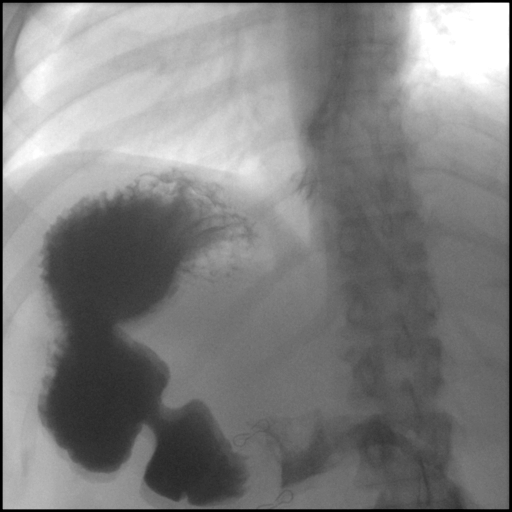

[Series 11: cp_standard · 0.35mm/px · 1 of 17 frames shown (10 of 11)]
[frame 15/17]
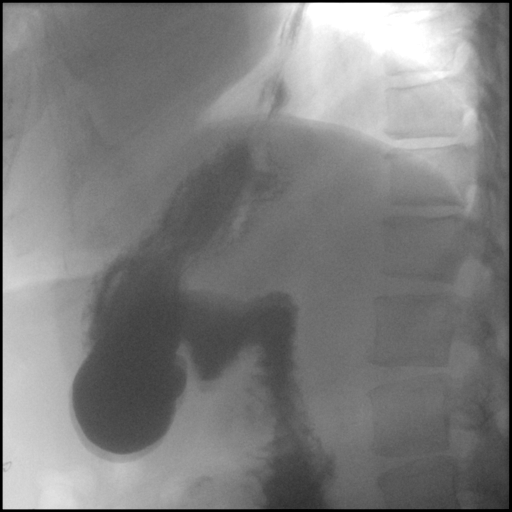

[Series 12: cp_standard · 0.34mm/px · 1 of 29 frames shown (11 of 11)]
[frame 25/29]
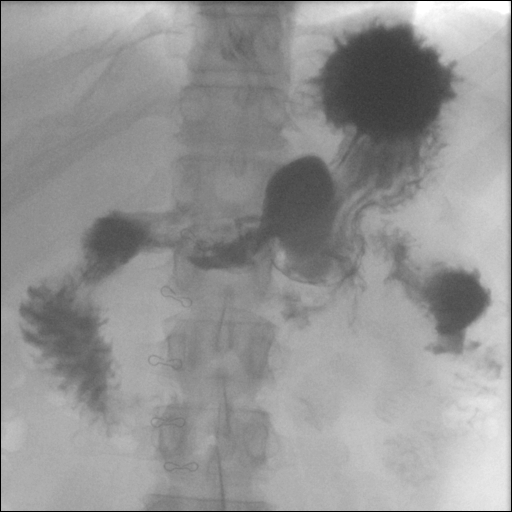

[18 of 24 positions shown; findings below may reference images not displayed]

FINDINGS: Initial KUB appears normal.

Pharyngeal phase of swallowing normal. Minimal cervical spondylosis.

Primary peristaltic waves were normal on [DATE] swallows. Small
type 1 hiatal hernia.

Mucosal relief compression views of the stomach and proximal
duodenum unremarkable.

Very widely patent (greater than 2 cm) distal esophageal mucosal
ring just above the hiatal hernia.

No gastric or duodenal ulcer or mass is identified.  No malrotation.

No reflux observed.
IMPRESSION: 1. Small type 1 hiatal hernia.
2. Very widely patent (greater than 2 cm) distal esophageal mucosal
ring just above the hiatal hernia.
3. Minimal cervical spondylosis.
4. Otherwise normal.

## 2018-02-01 IMAGING — CR DG CHEST 2V
2 series · 2 of 2 positions shown · non-contrast
Comparison: No recent prior .

CLINICAL DATA: Gastrectomy.

EXAM:
CHEST  2 VIEW

[w chest pa]
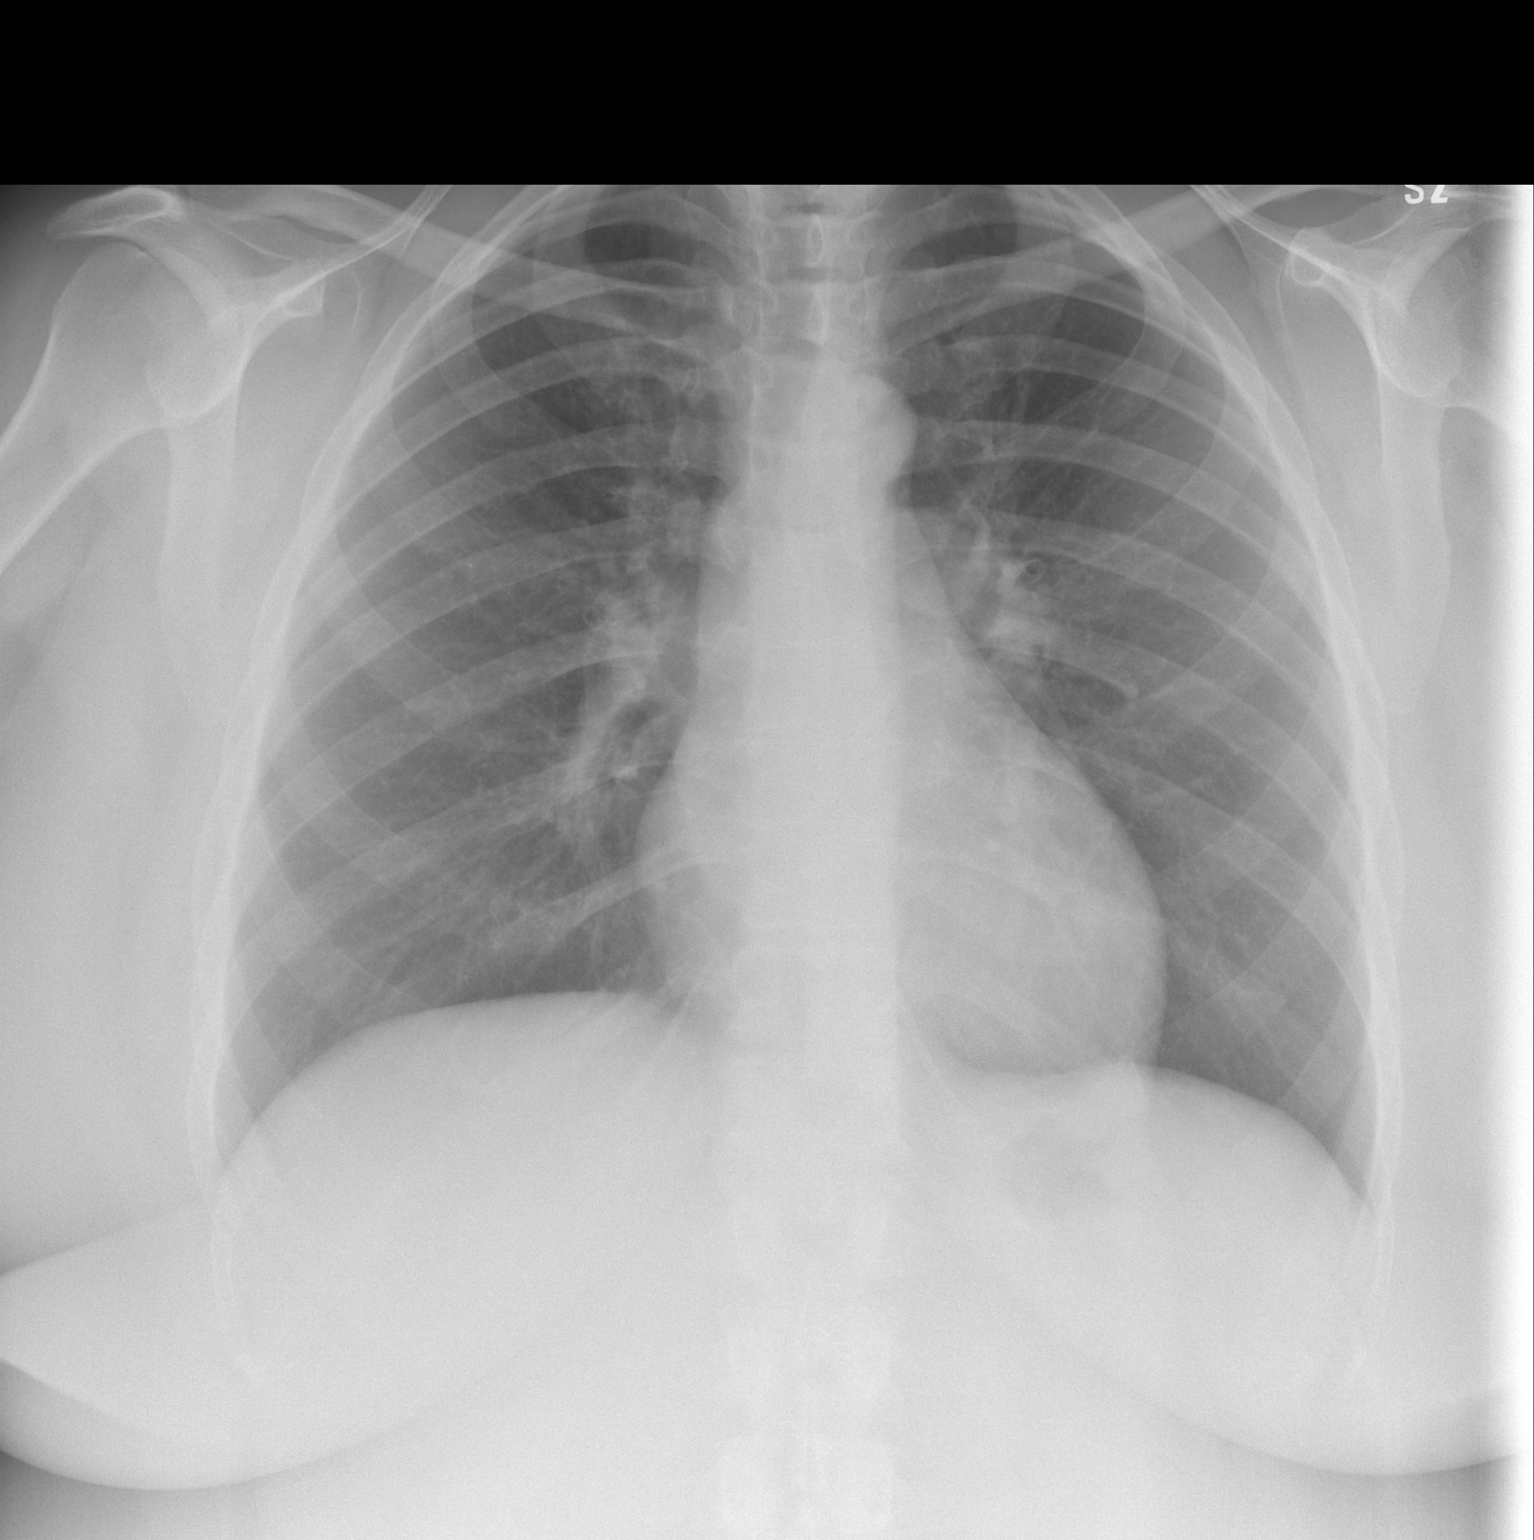

[w chest lat]
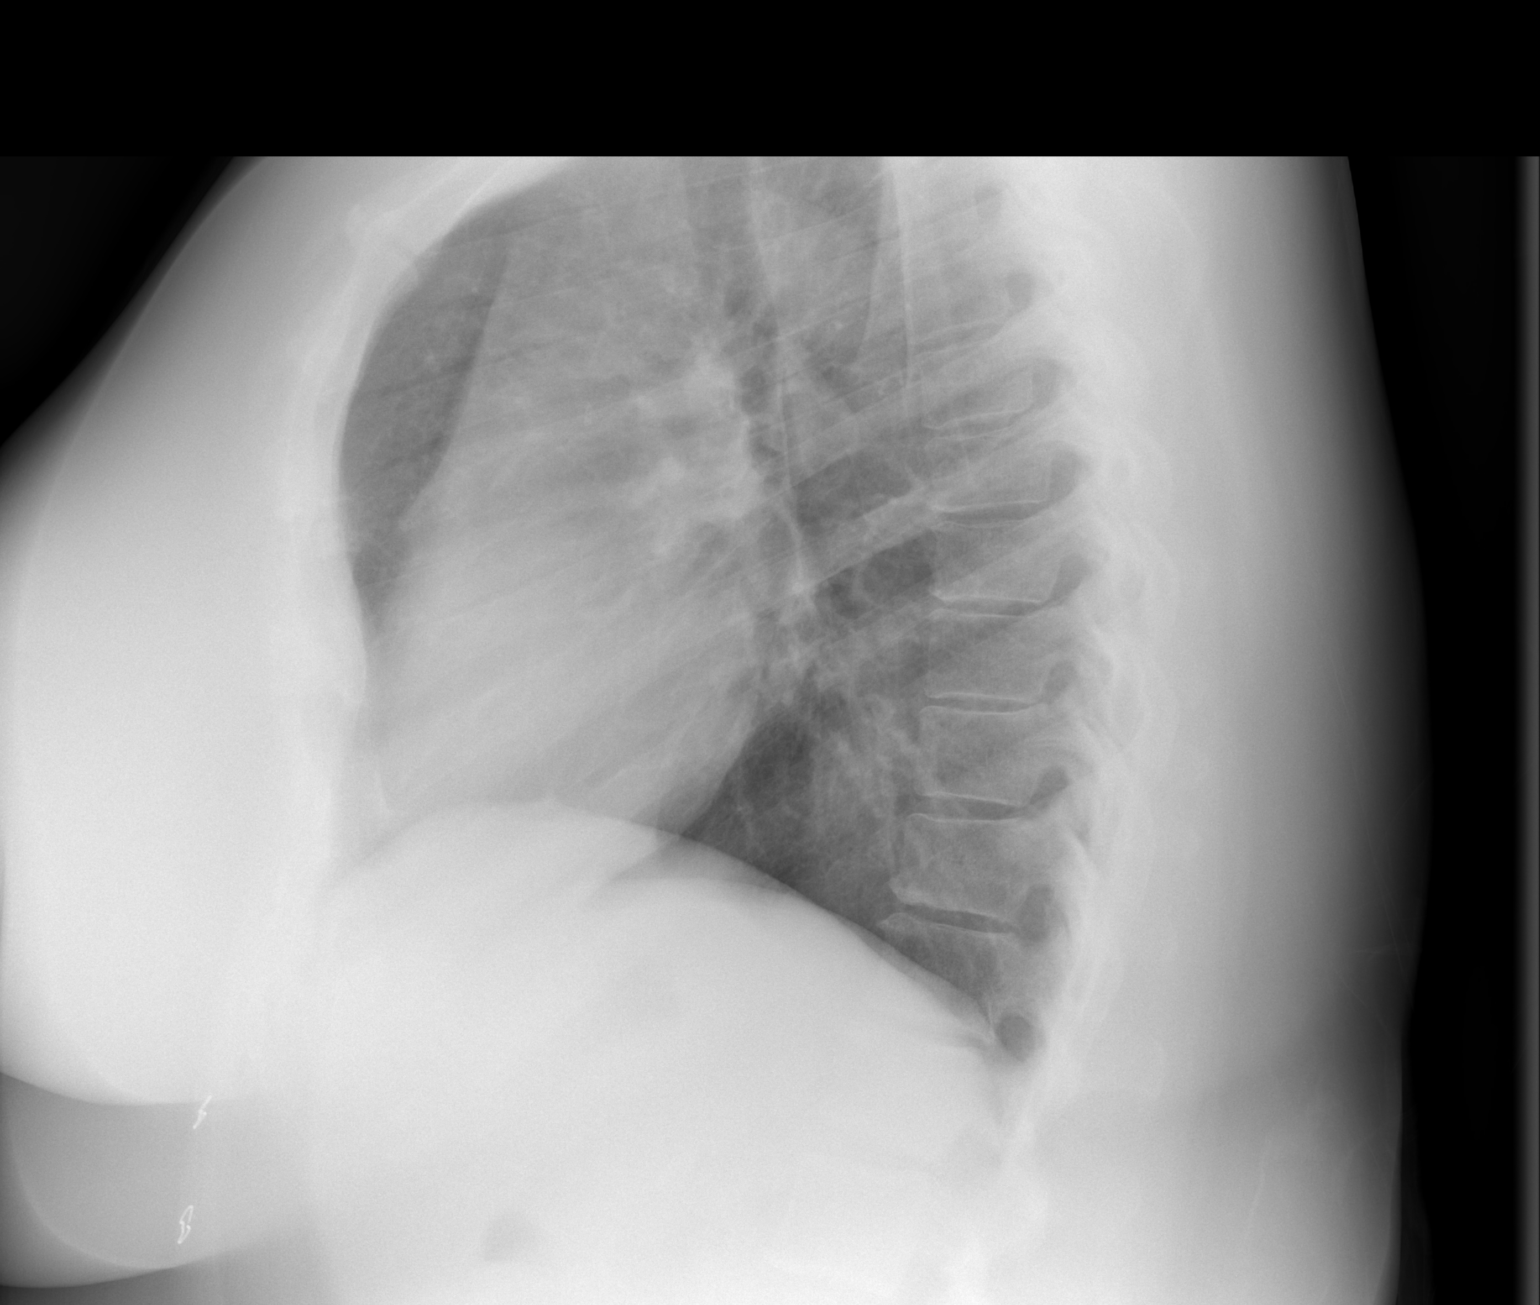

[2 of 2 positions shown; findings below may reference images not displayed]

FINDINGS: Mediastinum hilar structures normal. Lungs are clear. No pleural
effusion or pneumothorax. Heart size normal. No acute bony
abnormality.
IMPRESSION: No acute cardiopulmonary disease.

## 2018-10-24 ENCOUNTER — Encounter: Payer: Self-pay | Admitting: Internal Medicine

## 2018-11-08 ENCOUNTER — Ambulatory Visit: Payer: BC Managed Care – PPO | Admitting: Internal Medicine

## 2018-11-08 ENCOUNTER — Encounter: Payer: Self-pay | Admitting: Internal Medicine

## 2018-11-08 VITALS — BP 114/72 | HR 65 | Ht 65.0 in | Wt 169.0 lb

## 2018-11-08 DIAGNOSIS — I421 Obstructive hypertrophic cardiomyopathy: Secondary | ICD-10-CM | POA: Diagnosis not present

## 2018-11-08 DIAGNOSIS — I10 Essential (primary) hypertension: Secondary | ICD-10-CM

## 2018-11-08 NOTE — Patient Instructions (Addendum)
Medication Instructions:  Your physician recommends that you continue on your current medications as directed. Please refer to the Current Medication list given to you today.  Labwork: None ordered.  Testing/Procedures: None ordered.  Follow-Up: Your physician wants you to follow-up in: as needed with Dr. Taylor.      Any Other Special Instructions Will Be Listed Below (If Applicable).  If you need a refill on your cardiac medications before your next appointment, please call your pharmacy.   

## 2018-11-08 NOTE — Progress Notes (Signed)
HPI Mrs. Tipping returns today after a long absence from our arrhythmia clinic. She is a pleasant 53 yo woman with HCM with mild SAM. She has undergone gastric sleeve placement and lost 50 lbs. She feels much better but now is pending breast reduction surgery. She is active and does not have syncope, near syncope, chest pain or sob. No problem with her meds. No edema. Allergies  Allergen Reactions  . Tape Itching and Rash    Plastic tape/adhesives   Paper tape is ok     Current Outpatient Medications  Medication Sig Dispense Refill  . ALPRAZolam (XANAX) 0.25 MG tablet Take 1 tablet by mouth 2 (two) times daily as needed for anxiety.     . calcium-vitamin D (CALCIUM 500/D) 500-200 MG-UNIT tablet Take 1 tablet by mouth 3 (three) times daily.    Marland Kitchen latanoprost (XALATAN) 0.005 % ophthalmic solution Place 1 drop into both eyes at bedtime.    . metoprolol tartrate (LOPRESSOR) 50 MG tablet Take 1.5 tablets (75 mg total) by mouth 2 (two) times daily. 90 tablet 11  . montelukast (SINGULAIR) 10 MG tablet Take 10 mg by mouth daily.    . Multiple Vitamins-Minerals (WOMENS MULTI VITAMIN & MINERAL PO) Take 1 tablet by mouth daily.    Marland Kitchen omeprazole (PRILOSEC) 20 MG capsule Take 1 capsule (20 mg total) by mouth daily. 90 capsule 1  . VENTOLIN HFA 108 (90 BASE) MCG/ACT inhaler INHALE 1 TO 2 PUFFS EVERY 4 TO 6 HOURS IF NEEDED FOR WHEEZING  4   No current facility-administered medications for this visit.      Past Medical History:  Diagnosis Date  . Anxiety   . Asthma    bring inhaler dos  . Fibroid   . Glaucoma   . Headache(784.0)   . HOCM (hypertrophic obstructive cardiomyopathy) ([place])   . Hypertension   . LVH (left ventricular hypertrophy)    TTE showed severe LVH with SAM & a resting LVOT gradient of 36mmHg which increased to 56mmHg with Valsalva   . Sleep apnea    cpap  . SOB (shortness of breath) on exertion    During moderate exercise    ROS:   All systems reviewed and  negative except as noted in the HPI.   Past Surgical History:  Procedure Laterality Date  . ABDOMINAL HYSTERECTOMY  07/30/2012   Procedure: HYSTERECTOMY ABDOMINAL;  Surgeon: Bennetta Laos, MD;  Location: [facility];  Service: Gynecology;  Laterality: N/A;  . ABDOMINOPLASTY  2006  . BREAST SURGERY  2006   BREAST LIFT  . CESAREAN SECTION     3 TIMES  . COLONOSCOPY W/ POLYPECTOMY  05/27/2015   With biopsy  . LAPAROSCOPIC GASTRIC SLEEVE RESECTION WITH HIATAL HERNIA REPAIR N/A 10/31/2016   Procedure: LAPAROSCOPIC GASTRIC SLEEVE RESECTION WITH HIATAL HERNIA REPAIR, UPPER ENDO;  Surgeon: Arta Bruce Kinsinger, MD;  Location: [facility];  Service: General;  Laterality: N/A;  . LAPAROSCOPY  07/30/2012   Procedure: LAPAROSCOPY DIAGNOSTIC;  Surgeon: Bennetta Laos, MD;  Location: [facility];  Service: Gynecology;  Laterality: N/A;  attempted  . SALPINGOOPHORECTOMY  07/30/2012   Procedure: SALPINGO OOPHORECTOMY;  Surgeon: Bennetta Laos, MD;  Location: [facility];  Service: Gynecology;  Laterality: Right;  . UPPER GI ENDOSCOPY  10/31/2016   Procedure: UPPER GI ENDOSCOPY;  Surgeon: Arta Bruce Kinsinger, MD;  Location: [facility];  Service: General;;     Family History  Problem Relation Age of Onset  . Hypertension Mother   .  Cancer Mother   . Lung cancer Father   . Melanoma Father   . Heart disease Father        HCM, s/p ICD implant  . Stroke Other      Social History   Socioeconomic History  . Marital status: Married    Spouse name: Not on file  . Number of children: Not on file  . Years of education: Not on file  . Highest education level: Not on file  Occupational History  . Not on file  Social Needs  . Financial resource strain: Not on file  . Food insecurity:    Worry: Not on file    Inability: Not on file  . Transportation needs:    Medical: Not on file    Non-medical: Not on file  Tobacco Use  . Smoking status: Former Smoker    Packs/day: 1.00    Types: Cigarettes    Last  attempt to quit: 05/25/2008    Years since quitting: 10.4  . Smokeless tobacco: Never Used  Substance and Sexual Activity  . Alcohol use: Yes    Comment: occasional  . Drug use: No  . Sexual activity: Yes    Birth control/protection: Surgical  Lifestyle  . Physical activity:    Days per week: Not on file    Minutes per session: Not on file  . Stress: Not on file  Relationships  . Social connections:    Talks on phone: Not on file    Gets together: Not on file    Attends religious service: Not on file    Active member of club or organization: Not on file    Attends meetings of clubs or organizations: Not on file    Relationship status: Not on file  . Intimate partner violence:    Fear of current or ex partner: Not on file    Emotionally abused: Not on file    Physically abused: Not on file    Forced sexual activity: Not on file  Other Topics Concern  . Not on file  Social History Narrative  . Not on file     BP 114/72   Pulse 65   Ht 5\' 5"  (1.651 m)   Wt 169 lb (76.7 kg)   LMP 07/30/2012   SpO2 96%   BMI 28.12 kg/m   Physical Exam:  Well appearing NAD HEENT: Unremarkable Neck:  No JVD, no thyromegally Lymphatics:  No adenopathy Back:  No CVA tenderness Lungs:  Clear with now heezes HEART:  Regular rate rhythm, 2/6 murmurs, no rubs, no clicks Abd:  soft, positive bowel sounds, no organomegally, no rebound, no guarding Ext:  2 plus pulses, no edema, no cyanosis, no clubbing Skin:  No rashes no nodules Neuro:  CN II through XII intact, motor grossly intact  EKG - nsr with inferior STT abnormality.   Assess/Plan: 1. Preoperative eval - she is low risk for pending breast reduction surgery. She may proceed with surgery.  2. HCM - the patient is currently asymptomatic and is very active. No limit to her activity. She will continue her beta blocker 3. HTN - her blood pressure is well controlled.  4. Obesity - she has lost 50 lbs!. She is encouraged to  continue.  Mikle Bosworth.D.

## 2018-11-13 ENCOUNTER — Other Ambulatory Visit: Payer: Self-pay | Admitting: Physician Assistant

## 2019-06-13 ENCOUNTER — Encounter (HOSPITAL_COMMUNITY): Payer: Self-pay

## 2019-12-20 ENCOUNTER — Other Ambulatory Visit: Payer: Self-pay | Admitting: Family Medicine

## 2019-12-20 DIAGNOSIS — R7989 Other specified abnormal findings of blood chemistry: Secondary | ICD-10-CM

## 2019-12-26 ENCOUNTER — Ambulatory Visit
Admission: RE | Admit: 2019-12-26 | Discharge: 2019-12-26 | Disposition: A | Discharge status: Home / Self Care | Payer: BC Managed Care – PPO | Source: Ambulatory Visit | Attending: Family Medicine | Admitting: Family Medicine

## 2019-12-26 ENCOUNTER — Other Ambulatory Visit: Payer: Self-pay

## 2019-12-26 DIAGNOSIS — R7989 Other specified abnormal findings of blood chemistry: Secondary | ICD-10-CM

## 2020-01-01 ENCOUNTER — Other Ambulatory Visit: Payer: Self-pay | Admitting: Internal Medicine

## 2020-03-14 ENCOUNTER — Encounter (HOSPITAL_COMMUNITY): Payer: Self-pay | Admitting: Emergency Medicine

## 2020-03-14 ENCOUNTER — Emergency Department (HOSPITAL_COMMUNITY)
Admission: EM | Admit: 2020-03-14 | Discharge: 2020-03-14 | Disposition: A | Discharge status: Home / Self Care | Payer: BC Managed Care – PPO | Attending: Emergency Medicine | Admitting: Emergency Medicine

## 2020-03-14 ENCOUNTER — Other Ambulatory Visit: Payer: Self-pay

## 2020-03-14 ENCOUNTER — Emergency Department (HOSPITAL_COMMUNITY): Payer: BC Managed Care – PPO

## 2020-03-14 DIAGNOSIS — W109XXA Fall (on) (from) unspecified stairs and steps, initial encounter: Secondary | ICD-10-CM | POA: Diagnosis not present

## 2020-03-14 DIAGNOSIS — I10 Essential (primary) hypertension: Secondary | ICD-10-CM | POA: Diagnosis not present

## 2020-03-14 DIAGNOSIS — J45909 Unspecified asthma, uncomplicated: Secondary | ICD-10-CM | POA: Insufficient documentation

## 2020-03-14 DIAGNOSIS — Z87891 Personal history of nicotine dependence: Secondary | ICD-10-CM | POA: Diagnosis not present

## 2020-03-14 DIAGNOSIS — S0990XA Unspecified injury of head, initial encounter: Secondary | ICD-10-CM | POA: Insufficient documentation

## 2020-03-14 DIAGNOSIS — Y999 Unspecified external cause status: Secondary | ICD-10-CM | POA: Diagnosis not present

## 2020-03-14 DIAGNOSIS — Y939 Activity, unspecified: Secondary | ICD-10-CM | POA: Insufficient documentation

## 2020-03-14 DIAGNOSIS — Y929 Unspecified place or not applicable: Secondary | ICD-10-CM | POA: Insufficient documentation

## 2020-03-14 DIAGNOSIS — Z23 Encounter for immunization: Secondary | ICD-10-CM | POA: Diagnosis not present

## 2020-03-14 DIAGNOSIS — Z79899 Other long term (current) drug therapy: Secondary | ICD-10-CM | POA: Insufficient documentation

## 2020-03-14 MED ORDER — TETANUS-DIPHTH-ACELL PERTUSSIS 5-2.5-18.5 LF-MCG/0.5 IM SUSP
0.5000 mL | Freq: Once | INTRAMUSCULAR | Status: AC
  Administered 2020-03-14: 0.5 mL via INTRAMUSCULAR
  Filled 2020-03-14: qty 0.5

## 2020-03-14 NOTE — Discharge Instructions (Addendum)
You were seen today for head injury, your CT scan of your head and neck are normal.  We also gave you a tetanus booster today in the emergency department.  If you start having any new or worsening concerning symptoms I want you to come back to the emergency department, these can include weakness, numbness and tingling, walking difficulties, worse headache, vision changes.  I want you to use the attached instructions and follow-up with your primary care in the next couple of days.  If you start having any pain, you can use Tylenol as prescribed on the bottle.

## 2020-03-14 NOTE — ED Triage Notes (Signed)
Pt brought to ED by [facility] from home after she fell backwards from 3 steps and hit her head. Per family pt had a LOC x 1 min. ETOH on board, no blood thinners, c- collar in place by EMS pta. BP 135/91, HR 80, R 16, SPO2 99% RA, CBG 109.

## 2020-03-14 NOTE — ED Provider Notes (Signed)
[facility] EMERGENCY DEPARTMENT Provider Note   CSN: [id number] Arrival date & time: 03/14/20  2009     History Chief Complaint  Patient presents with  . Fall    ADELINE PETITFRERE is a 54 y.o. female with pertinent past medical history of hypertension, LVH, sleep apnea, asthma, anxiety that presents emergency department today for fall via EMS.  Patient states that she fell backward onto her head on a couple of steps during a poor party this afternoon.  Patient states that it was concrete.  States that she hit her head, denies hitting anything else.  Reports LOC for about 45 seconds, states that there was alcohol on board.  States that she drank about 6 mixed drinks.  States that she thinks she remembers everything that occurred, no nausea vomiting, paresthesias, weakness, headache, vision changes.  Patient states that she is not any pain right now.  No previous head injury.  Patient states that scalp was initially bleeding, has stopped.  No neck pain, fevers, chills, your leg symptoms.  Patient was in normal health before this. . Denies any pain now.  HPI     Past Medical History:  Diagnosis Date  . Anxiety   . Asthma    bring inhaler dos  . Fibroid   . Glaucoma   . Headache(784.0)   . HOCM (hypertrophic obstructive cardiomyopathy) ([place])   . Hypertension   . LVH (left ventricular hypertrophy)    TTE showed severe LVH with SAM & a resting LVOT gradient of 62mmHg which increased to 78mmHg with Valsalva   . Sleep apnea    cpap  . SOB (shortness of breath) on exertion    During moderate exercise    Patient Active Problem List   Diagnosis Date Noted  . Obesity 10/31/2016  . GI bleed 06/09/2015  . Acute blood loss anemia 06/09/2015  . Asthma 06/09/2015  . Dyspnea 08/12/2014  . Palpitations 08/12/2014  . Hypertrophic obstructive cardiomyopathy (HOCM) ([place]) 08/28/2013  . Essential hypertension 08/28/2013  . Endometriosis 08/23/2012    Past Surgical History:    Procedure Laterality Date  . ABDOMINAL HYSTERECTOMY  07/30/2012   Procedure: HYSTERECTOMY ABDOMINAL;  Surgeon: Bennetta Laos, MD;  Location: [facility];  Service: Gynecology;  Laterality: N/A;  . ABDOMINOPLASTY  2006  . BREAST SURGERY  2006   BREAST LIFT  . CESAREAN SECTION     3 TIMES  . COLONOSCOPY W/ POLYPECTOMY  05/27/2015   With biopsy  . LAPAROSCOPIC GASTRIC SLEEVE RESECTION WITH HIATAL HERNIA REPAIR N/A 10/31/2016   Procedure: LAPAROSCOPIC GASTRIC SLEEVE RESECTION WITH HIATAL HERNIA REPAIR, UPPER ENDO;  Surgeon: Arta Bruce Kinsinger, MD;  Location: [facility];  Service: General;  Laterality: N/A;  . LAPAROSCOPY  07/30/2012   Procedure: LAPAROSCOPY DIAGNOSTIC;  Surgeon: Bennetta Laos, MD;  Location: [facility];  Service: Gynecology;  Laterality: N/A;  attempted  . SALPINGOOPHORECTOMY  07/30/2012   Procedure: SALPINGO OOPHORECTOMY;  Surgeon: Bennetta Laos, MD;  Location: [facility];  Service: Gynecology;  Laterality: Right;  . UPPER GI ENDOSCOPY  10/31/2016   Procedure: UPPER GI ENDOSCOPY;  Surgeon: Arta Bruce Kinsinger, MD;  Location: [facility];  Service: General;;     OB History    Gravida  5   Para  3   Term  3   Preterm      AB      Living  3     SAB      TAB  Ectopic      Multiple      Live Births              Family History  Problem Relation Age of Onset  . Hypertension Mother   . Cancer Mother   . Lung cancer Father   . Melanoma Father   . Heart disease Father        HCM, s/p ICD implant  . Stroke Other     Social History   Tobacco Use  . Smoking status: Former Smoker    Packs/day: 1.00    Types: Cigarettes    Quit date: 05/25/2008    Years since quitting: 11.8  . Smokeless tobacco: Never Used  Vaping Use  . Vaping Use: Never used  Substance Use Topics  . Alcohol use: Yes    Comment: occasional  . Drug use: No    Home Medications Prior to Admission medications   Medication Sig Start Date End Date Taking? Authorizing Provider   ALPRAZolam Duanne Moron) 0.25 MG tablet Take 1 tablet by mouth 2 (two) times daily as needed for anxiety.  10/05/16   [provider]  calcium-vitamin D (CALCIUM 500/D) 500-200 MG-UNIT tablet Take 1 tablet by mouth 3 (three) times daily.    [provider]  latanoprost (XALATAN) 0.005 % ophthalmic solution Place 1 drop into both eyes at bedtime.    [provider]  metoprolol tartrate (LOPRESSOR) 50 MG tablet TAKE 1.5 TABLETS (75 MG TOTAL) BY MOUTH 2 (TWO) TIMES DAILY. 01/01/20   Evans Lance, MD  montelukast (SINGULAIR) 10 MG tablet Take 10 mg by mouth daily. 06/22/18   [provider]  Multiple Vitamins-Minerals (WOMENS MULTI VITAMIN & MINERAL PO) Take 1 tablet by mouth daily.    [provider]  omeprazole (PRILOSEC) 20 MG capsule Take 1 capsule (20 mg total) by mouth daily. 11/01/16   Kinsinger, Arta Bruce, MD  VENTOLIN HFA 108 (90 BASE) MCG/ACT inhaler INHALE 1 TO 2 PUFFS EVERY 4 TO 6 HOURS IF NEEDED FOR WHEEZING 04/23/15   [provider]    Allergies    Tape  Review of Systems   Review of Systems  Constitutional: Negative for chills, diaphoresis, fatigue and fever.  HENT: Negative for congestion, sore throat and trouble swallowing.   Eyes: Negative for pain and visual disturbance.  Respiratory: Negative for cough, shortness of breath and wheezing.   Cardiovascular: Negative for chest pain, palpitations and leg swelling.  Gastrointestinal: Negative for abdominal distention, abdominal pain, diarrhea, nausea and vomiting.  Genitourinary: Negative for difficulty urinating.  Musculoskeletal: Negative for back pain, neck pain and neck stiffness.  Skin: Negative for pallor.  Neurological: Negative for dizziness, speech difficulty, weakness and headaches.  Psychiatric/Behavioral: Negative for confusion.    Physical Exam Updated Vital Signs BP 138/80   Pulse 81   Temp 98.1 F (36.7 C) (Oral)   Resp 18   Ht 5\' 5"  (1.651 m)   Wt 76.7 kg    LMP 07/30/2012   SpO2 97%   BMI 28.14 kg/m   Physical Exam Constitutional:      General: She is not in acute distress.    Appearance: Normal appearance. She is not ill-appearing, toxic-appearing or diaphoretic.  HENT:     Head: Normocephalic. No raccoon eyes, Battle's sign, contusion or masses.     Comments: Small 3 mm superficial laceration on scalp, is not bleeding and is scabbed over. Eyes:     Extraocular Movements: Extraocular movements intact.  Pupils: Pupils are equal, round, and reactive to light.  Cardiovascular:     Rate and Rhythm: Normal rate and regular rhythm.     Pulses: Normal pulses.  Pulmonary:     Effort: Pulmonary effort is normal.     Breath sounds: Normal breath sounds.  Musculoskeletal:        General: Normal range of motion.  Skin:    General: Skin is warm and dry.     Capillary Refill: Capillary refill takes less than 2 seconds.  Neurological:     General: No focal deficit present.     Mental Status: She is alert and oriented to person, place, and time. Mental status is at baseline.     Comments: Alert and oriented times 3. Clear speech. No facial droop. CNIII-XII grossly intact. Bilateral upper and lower extremities' sensation grossly intact. 5/5 symmetric strength with grip strength and with plantar and dorsi flexion bilaterally. Normal finger to nose bilaterally. Negative pronator drift. Negative Romberg sign. Gait is steady and intact    Psychiatric:        Mood and Affect: Mood normal.        Behavior: Behavior normal.        Thought Content: Thought content normal.     ED Results / Procedures / Treatments   Labs (all labs ordered are listed, but only abnormal results are displayed) Labs Reviewed - No data to display  EKG None  Radiology CT Head Wo Contrast  Result Date: 03/14/2020 CLINICAL DATA:  Mechanical fall with posterior head strike against mulch EXAM: CT HEAD WITHOUT CONTRAST CT CERVICAL SPINE WITHOUT CONTRAST TECHNIQUE:  Multidetector CT imaging of the head and cervical spine was performed following the standard protocol without intravenous contrast. Multiplanar CT image reconstructions of the cervical spine were also generated. COMPARISON:  None. FINDINGS: CT HEAD FINDINGS Brain: No evidence of acute infarction, hemorrhage, hydrocephalus, extra-axial collection or mass lesion/mass effect. Symmetric prominence of the ventricles, cisterns and sulci compatible with mild senescent parenchymal volume loss. Vascular: No hyperdense vessel or unexpected calcification. Skull: At most minimal occipital scalp thickening and stranding (4/31, soft tissue windows). No calvarial fracture or suspicious calvarial lesions. Few subcutaneous partially calcified high attenuation nodule seen in scalp towards the vertex (4/73, 71) have appearance most compatible benign trichilemmal cysts. Sinuses/Orbits: Paranasal sinuses and mastoid air cells are predominantly clear. Included orbital structures are unremarkable. Other: None CT CERVICAL SPINE FINDINGS Alignment: Cervical stabilization collar absent at the time of examination. Straightening and mild reversal the normal cervical lordosis centered at the C5-6 level No evidence of traumatic listhesis. No abnormally widened, perched or jumped facets. Normal alignment of the craniocervical and atlantoaxial articulations. Skull base and vertebrae: No acute no skull base fractures. No vertebral body fractures or height loss. Mild arthrosis at the atlantodental interval. Benign bone island in the C7 vertebral body extending into the pedicle. Spondylitic and facet degenerative changes, as detailed below. Soft tissues and spinal canal: No pre or paravertebral fluid or swelling. No visible canal hematoma. Disc levels: Multilevel intervertebral disc height loss with spondylitic endplate changes. Features most pronounced at the C5-C7 levels with posterior disc osteophyte complexes resulting in at most mild canal  stenosis. Minimal uncinate spurring at C3-4 and C5-6 resulting in at most mild foraminal narrowing bilaterally. Upper chest: No acute abnormality in the upper chest or imaged lung apices. Apical emphysematous changes. Other: No concerning thyroid nodules or masses. IMPRESSION: 1. No acute intracranial abnormality. 2. At most minimal occipital scalp thickening and  stranding, correlate for point tenderness. No calvarial fracture. 3. No acute cervical spine fracture or traumatic listhesis. 4. Multilevel degenerative changes of the cervical spine, most pronounced at the C5-C7 levels. 5. Few subcutaneous partially calcified high attenuation nodules in the scalp towards the vertex have appearance most compatible with benign trichilemmal cysts. Electronically Signed   By: Lovena Le M.D.   On: 03/14/2020 23:08   CT Cervical Spine Wo Contrast  Result Date: 03/14/2020 CLINICAL DATA:  Mechanical fall with posterior head strike against mulch EXAM: CT HEAD WITHOUT CONTRAST CT CERVICAL SPINE WITHOUT CONTRAST TECHNIQUE: Multidetector CT imaging of the head and cervical spine was performed following the standard protocol without intravenous contrast. Multiplanar CT image reconstructions of the cervical spine were also generated. COMPARISON:  None. FINDINGS: CT HEAD FINDINGS Brain: No evidence of acute infarction, hemorrhage, hydrocephalus, extra-axial collection or mass lesion/mass effect. Symmetric prominence of the ventricles, cisterns and sulci compatible with mild senescent parenchymal volume loss. Vascular: No hyperdense vessel or unexpected calcification. Skull: At most minimal occipital scalp thickening and stranding (4/31, soft tissue windows). No calvarial fracture or suspicious calvarial lesions. Few subcutaneous partially calcified high attenuation nodule seen in scalp towards the vertex (4/73, 71) have appearance most compatible benign trichilemmal cysts. Sinuses/Orbits: Paranasal sinuses and mastoid air cells are  predominantly clear. Included orbital structures are unremarkable. Other: None CT CERVICAL SPINE FINDINGS Alignment: Cervical stabilization collar absent at the time of examination. Straightening and mild reversal the normal cervical lordosis centered at the C5-6 level No evidence of traumatic listhesis. No abnormally widened, perched or jumped facets. Normal alignment of the craniocervical and atlantoaxial articulations. Skull base and vertebrae: No acute no skull base fractures. No vertebral body fractures or height loss. Mild arthrosis at the atlantodental interval. Benign bone island in the C7 vertebral body extending into the pedicle. Spondylitic and facet degenerative changes, as detailed below. Soft tissues and spinal canal: No pre or paravertebral fluid or swelling. No visible canal hematoma. Disc levels: Multilevel intervertebral disc height loss with spondylitic endplate changes. Features most pronounced at the C5-C7 levels with posterior disc osteophyte complexes resulting in at most mild canal stenosis. Minimal uncinate spurring at C3-4 and C5-6 resulting in at most mild foraminal narrowing bilaterally. Upper chest: No acute abnormality in the upper chest or imaged lung apices. Apical emphysematous changes. Other: No concerning thyroid nodules or masses. IMPRESSION: 1. No acute intracranial abnormality. 2. At most minimal occipital scalp thickening and stranding, correlate for point tenderness. No calvarial fracture. 3. No acute cervical spine fracture or traumatic listhesis. 4. Multilevel degenerative changes of the cervical spine, most pronounced at the C5-C7 levels. 5. Few subcutaneous partially calcified high attenuation nodules in the scalp towards the vertex have appearance most compatible with benign trichilemmal cysts. Electronically Signed   By: Lovena Le M.D.   On: 03/14/2020 23:08    Procedures Procedures (including critical care time)  Medications Ordered in ED Medications  Tdap  (BOOSTRIX) injection 0.5 mL (0.5 mLs Intramuscular Given 03/14/20 2223)    ED Course  I have reviewed the triage vital signs and the nursing notes.  Pertinent labs & imaging results that were available during my care of the patient were reviewed by me and considered in my medical decision making (see chart for details).    MDM Rules/Calculators/A&P                         DYANA MAGNER is a 54 y.o. female with  pertinent past medical history of hypertension, LVH, sleep apnea, asthma, anxiety that presents emergency department today for fall via EMS.  Patient did hit her head, with LOC.  Alcohol on board, no blood thinners.  Will obtain CT scan today.  Normal neuro exam.  CT head and neck without any acute intracranial abnormalities.  Went over incidental findings with patient, patient is already aware of all the incidental findings.  Patient continually denies any pain.  States that she is now sober.  Small superficial laceration on, bleeding controlled.  Tetanus given.  Patient be discharged, patient agreeable.Doubt need for further emergent work up at this time. I explained the diagnosis and have given explicit precautions to return to the ER including for any other new or worsening symptoms. The patient understands and accepts the medical plan as it's been dictated and I have answered their questions. Discharge instructions concerning home care and prescriptions have been given. The patient is STABLE and is discharged to home in good condition.    Final Clinical Impression(s) / ED Diagnoses Final diagnoses:  Injury of head, initial encounter    Rx / DC Orders ED Discharge Orders    None       Alfredia Client, PA-C 03/14/20 2333    Sherwood Gambler, MD 03/15/20 2209

## 2020-05-20 ENCOUNTER — Encounter (HOSPITAL_COMMUNITY): Payer: Self-pay

## 2020-08-20 ENCOUNTER — Other Ambulatory Visit: Payer: Self-pay | Admitting: Internal Medicine

## 2021-01-16 ENCOUNTER — Other Ambulatory Visit: Payer: Self-pay | Admitting: Internal Medicine

## 2021-03-02 ENCOUNTER — Other Ambulatory Visit: Payer: Self-pay | Admitting: Internal Medicine

## 2021-03-24 ENCOUNTER — Other Ambulatory Visit: Payer: Self-pay | Admitting: Internal Medicine

## 2021-03-26 ENCOUNTER — Telehealth: Payer: Self-pay | Admitting: Internal Medicine

## 2021-03-26 MED ORDER — METOPROLOL TARTRATE 50 MG PO TABS: 75.0000 mg | ORAL_TABLET | Freq: Two times a day (BID) | ORAL | 0 refills | Status: DC

## 2021-03-26 NOTE — Telephone Encounter (Signed)
Pt's medication was sent to pt's pharmacy as requested. Confirmation received.  °

## 2021-03-26 NOTE — Telephone Encounter (Signed)
*  STAT* If patient is at the pharmacy, call can be transferred to refill team.   1. Which medications need to be refilled? (please list name of each medication and dose if known) Metoprolol  2. Which pharmacy/location (including street and city if local pharmacy) is medication to be sent to? [facility] [place], [place]  3. Do they need a 30 day or 90 day supply? Enough until her appt on 04-19-21 with Jonni Sanger

## 2021-04-16 NOTE — Progress Notes (Signed)
PCP:  London Pepper, MD Primary Cardiologist: None Electrophysiologist: Cristopher Peru, MD   Monica Marks is a 55 y.o. female seen today for Cristopher Peru, MD for  routeine cardiac follow up after a long absence.  Last seen 10/2018 .    At last visit she had lost 50 lbs and was pending breast reduction surgery. This was ultimately cancelled as the surgical center refused to perform despite cardiac clearance.  Since last being seen in our clinic the patient reports doing very well. She has maintained her weight loss.  she denies chest pain, palpitations, dyspnea, PND, orthopnea, nausea, vomiting, dizziness, syncope, edema, weight gain, or early satiety.  Past Medical History:  Diagnosis Date   Anxiety    Asthma    bring inhaler dos   Fibroid    Glaucoma    Headache(784.0)    HOCM (hypertrophic obstructive cardiomyopathy) ([place])    Hypertension    LVH (left ventricular hypertrophy)    TTE showed severe LVH with SAM & a resting LVOT gradient of 50mHg which increased to 41mg with Valsalva    Sleep apnea    cpap   SOB (shortness of breath) on exertion    During moderate exercise   Past Surgical History:  Procedure Laterality Date   ABDOMINAL HYSTERECTOMY  07/30/2012   Procedure: HYSTERECTOMY ABDOMINAL;  Surgeon: DaBennetta LaosMD;  [facility];  Service: Gynecology;  Laterality: N/A;   ABDOMINOPLASTY  2006   BREAST SURGERY  2006   BREAST LIFT   CESAREAN SECTION     3 TIMES   COLONOSCOPY W/ POLYPECTOMY  05/27/2015   With biopsy   LAPAROSCOPIC GASTRIC SLEEVE RESECTION WITH HIATAL HERNIA REPAIR N/A 10/31/2016   Procedure: LAPAROSCOPIC GASTRIC SLEEVE RESECTION WITH HIATAL HERNIA REPAIR, UPPER ENDO;  Surgeon: LuMickeal SkinnerMD;  Location[facility];  Service: General;  Laterality: N/A;   LAPAROSCOPY  07/30/2012   Procedure: LAPAROSCOPY DIAGNOSTIC;  Surgeon: DaBennetta LaosMD;  [facility];  Service: Gynecology;  Laterality: N/A;  attempted    SALPINGOOPHORECTOMY  07/30/2012   Procedure: SALPINGO OOPHORECTOMY;  Surgeon: DaBennetta LaosMD;  [facility];  Service: Gynecology;  Laterality: Right;   UPPER GI ENDOSCOPY  10/31/2016   Procedure: UPPER GI ENDOSCOPY;  Surgeon: LuArta Bruceinsinger, MD;  Location: [facility];  Service: General;;    Current Outpatient Medications  Medication Sig Dispense Refill   ALPRAZolam (XANAX) 0.25 MG tablet Take 1 tablet by mouth 2 (two) times daily as needed for anxiety.      calcium-vitamin D (OSCAL WITH D) 500-200 MG-UNIT tablet Take 1 tablet by mouth 3 (three) times daily.     escitalopram (LEXAPRO) 20 MG tablet Take 20 mg by mouth daily.     latanoprost (XALATAN) 0.005 % ophthalmic solution Place 1 drop into both eyes at bedtime.     metoprolol tartrate (LOPRESSOR) 50 MG tablet Take 1.5 tablets (75 mg total) by mouth 2 (two) times daily. Please keep upcoming appt with Cardiologist in August 2022 before anymore refills. Thank you Final Attempt 90 tablet 0   montelukast (SINGULAIR) 10 MG tablet Take 10 mg by mouth daily.     Multiple Vitamins-Minerals (WOMENS MULTI VITAMIN & MINERAL PO) Take 1 tablet by mouth daily.     omeprazole (PRILOSEC) 20 MG capsule Take 1 capsule (20 mg total) by mouth daily. 90 capsule 1   No current facility-administered medications for this visit.    Allergies  Allergen Reactions  Tape Itching and Rash    Plastic tape/adhesives   Paper tape is ok    Social History   Socioeconomic History   Marital status: Married    Spouse name: Not on file   Number of children: Not on file   Years of education: Not on file   Highest education level: Not on file  Occupational History   Not on file  Tobacco Use   Smoking status: Former    Packs/day: 1.00    Types: Cigarettes    Quit date: 05/25/2008    Years since quitting: 12.9   Smokeless tobacco: Never  Vaping Use   Vaping Use: Never used  Substance and Sexual Activity   Alcohol use: Yes    Comment:  occasional   Drug use: No   Sexual activity: Yes    Birth control/protection: Surgical  Other Topics Concern   Not on file  Social History Narrative   Not on file   Social Determinants of Health   Financial Resource Strain: Not on file  Food Insecurity: Not on file  Transportation Needs: Not on file  Physical Activity: Not on file  Stress: Not on file  Social Connections: Not on file  Intimate Partner Violence: Not on file     Review of Systems: All other systems reviewed and are otherwise negative except as noted above.  Physical Exam: Vitals:   04/19/21 1038  BP: 103/77  Pulse: 70  SpO2: 95%  Weight: 168 lb 9.6 oz (76.5 kg)  Height: '5\' 5"'$  (1.651 m)    Wt Readings from Last 3 Encounters:  04/19/21 168 lb 9.6 oz (76.5 kg)  03/14/20 169 lb 1.5 oz (76.7 kg)  11/08/18 169 lb (76.7 kg)    GEN- The patient is well appearing, alert and oriented x 3 today.   HEENT: normocephalic, atraumatic; sclera clear, conjunctiva pink; hearing intact; oropharynx clear; neck supple, no JVP Lymph- no cervical lymphadenopathy Lungs- Clear to ausculation bilaterally, normal work of breathing.  No wheezes, rales, rhonchi Heart- Regular rate and rhythm, no murmurs, rubs or gallops, PMI not laterally displaced GI- soft, non-tender, non-distended, bowel sounds present, no hepatosplenomegaly Extremities- no clubbing, cyanosis, or edema; DP/PT/radial pulses 2+ bilaterally MS- no significant deformity or atrophy Skin- warm and dry, no rash or lesion Psych- euthymic mood, full affect Neuro- strength and sensation are intact  EKG is ordered. Personal review of EKG from  today  shows NSR 70 at 78 ms, PR interval 148 ms  Additional studies reviewed include: Previous EP office notes  Assessment and Plan:  1. HCM Remains asymptomatic and very active Continue metoprolol 75 mg BID Update Echo.    2. HTN  BP well controled on metoprolol.   3. Obesity  She has kept all of her weight off.  Perhaps with udpating echo if we can show stability of her cardiomyopathy she could again be considered for breast reduction if she so desires.   Shirley Friar, PA-C  04/19/21 10:57 AM

## 2021-04-19 ENCOUNTER — Other Ambulatory Visit: Payer: Self-pay

## 2021-04-19 ENCOUNTER — Encounter: Payer: Self-pay | Admitting: Student

## 2021-04-19 ENCOUNTER — Ambulatory Visit (INDEPENDENT_AMBULATORY_CARE_PROVIDER_SITE_OTHER): Payer: BC Managed Care – PPO | Admitting: Student

## 2021-04-19 VITALS — BP 103/77 | HR 70 | Ht 65.0 in | Wt 168.6 lb

## 2021-04-19 DIAGNOSIS — I10 Essential (primary) hypertension: Secondary | ICD-10-CM | POA: Diagnosis not present

## 2021-04-19 DIAGNOSIS — I421 Obstructive hypertrophic cardiomyopathy: Secondary | ICD-10-CM

## 2021-04-19 LAB — COMPREHENSIVE METABOLIC PANEL
ALT: 51 IU/L — ABNORMAL HIGH (ref 0–32)
AST: 65 IU/L — ABNORMAL HIGH (ref 0–40)
Albumin/Globulin Ratio: 1.7 (ref 1.2–2.2)
Albumin: 4.5 g/dL (ref 3.8–4.9)
Alkaline Phosphatase: 113 IU/L (ref 44–121)
BUN/Creatinine Ratio: 8 — ABNORMAL LOW (ref 9–23)
BUN: 6 mg/dL (ref 6–24)
Bilirubin Total: 0.4 mg/dL (ref 0.0–1.2)
CO2: 26 mmol/L (ref 20–29)
Calcium: 9.3 mg/dL (ref 8.7–10.2)
Chloride: 93 mmol/L — ABNORMAL LOW (ref 96–106)
Creatinine, Ser: 0.73 mg/dL (ref 0.57–1.00)
Globulin, Total: 2.6 g/dL (ref 1.5–4.5)
Glucose: 69 mg/dL (ref 65–99)
Potassium: 4.2 mmol/L (ref 3.5–5.2)
Sodium: 138 mmol/L (ref 134–144)
Total Protein: 7.1 g/dL (ref 6.0–8.5)
eGFR: 98 mL/min/{1.73_m2} (ref >59)

## 2021-04-19 LAB — CBC WITH DIFFERENTIAL/PLATELET
Basophils Absolute: 0.1 10*3/uL (ref 0.0–0.2)
Basos: 1 %
EOS (ABSOLUTE): 0.2 10*3/uL (ref 0.0–0.4)
Eos: 3 %
Hematocrit: 45.7 % (ref 34.0–46.6)
Hemoglobin: 15.6 g/dL (ref 11.1–15.9)
Immature Grans (Abs): 0 10*3/uL (ref 0.0–0.1)
Immature Granulocytes: 1 %
Lymphocytes Absolute: 1.6 10*3/uL (ref 0.7–3.1)
Lymphs: 26 %
MCH: 32.5 pg (ref 26.6–33.0)
MCHC: 34.1 g/dL (ref 31.5–35.7)
MCV: 95 fL (ref 79–97)
Monocytes Absolute: 0.6 10*3/uL (ref 0.1–0.9)
Monocytes: 10 %
Neutrophils Absolute: 3.7 10*3/uL (ref 1.4–7.0)
Neutrophils: 59 %
Platelets: 233 10*3/uL (ref 150–450)
RBC: 4.8 x10E6/uL (ref 3.77–5.28)
RDW: 12.8 % (ref 11.7–15.4)
WBC: 6.1 10*3/uL (ref 3.4–10.8)

## 2021-04-19 MED ORDER — METOPROLOL TARTRATE 50 MG PO TABS: 75.0000 mg | ORAL_TABLET | Freq: Two times a day (BID) | ORAL | 3 refills | Status: DC

## 2021-04-19 NOTE — Patient Instructions (Signed)
Medication Instructions:  Your physician recommends that you continue on your current medications as directed. Please refer to the Current Medication list given to you today.  *If you need a refill on your cardiac medications before your next appointment, please call your pharmacy*   Lab Work: TODAY: BMET, CBC  If you have labs (blood work) drawn today and your tests are completely normal, you will receive your results only by: [place] (if you have MyChart) OR A paper copy in the mail If you have any lab test that is abnormal or we need to change your treatment, we will call you to review the results.   Testing/Procedures: Your physician has requested that you have an echocardiogram. Echocardiography is a painless test that uses sound waves to create images of your heart. It provides your doctor with information about the size and shape of your heart and how well your heart's chambers and valves are working. This procedure takes approximately one hour. There are no restrictions for this procedure.   Follow-Up: At [facility], you and your health needs are our priority.  As part of our continuing mission to provide you with exceptional heart care, we have created designated Provider Care Teams.  These Care Teams include your primary Cardiologist (physician) and Advanced Practice Providers (APPs -  Physician Assistants and Nurse Practitioners) who all work together to provide you with the care you need, when you need it.  Your next appointment:   1 year(s)  The format for your next appointment:   In Person  Provider:   You may see Cristopher Peru, MD or one of the following Advanced Practice Providers on your designated Care Team:   Tommye Standard, [place] [facility]" [place], [place]

## 2021-05-04 ENCOUNTER — Other Ambulatory Visit: Payer: Self-pay

## 2021-05-04 ENCOUNTER — Ambulatory Visit (HOSPITAL_COMMUNITY): Payer: BC Managed Care – PPO | Attending: Cardiology

## 2021-05-04 DIAGNOSIS — I10 Essential (primary) hypertension: Secondary | ICD-10-CM | POA: Diagnosis not present

## 2021-05-04 DIAGNOSIS — I421 Obstructive hypertrophic cardiomyopathy: Secondary | ICD-10-CM | POA: Diagnosis not present

## 2021-05-04 LAB — ECHOCARDIOGRAM COMPLETE
AR max vel: 3.62 cm2
AV Area VTI: 3.35 cm2
AV Area mean vel: 3.25 cm2
AV Mean grad: 11 mmHg
AV Peak grad: 18.5 mmHg
Ao pk vel: 2.15 m/s
Area-P 1/2: 3.72 cm2
S' Lateral: 1.45 cm

## 2021-07-12 ENCOUNTER — Emergency Department (HOSPITAL_BASED_OUTPATIENT_CLINIC_OR_DEPARTMENT_OTHER): Payer: BC Managed Care – PPO

## 2021-07-12 ENCOUNTER — Inpatient Hospital Stay (HOSPITAL_BASED_OUTPATIENT_CLINIC_OR_DEPARTMENT_OTHER)
Admission: EM | Admit: 2021-07-12 | Discharge: 2021-07-14 | DRG: 690 | Disposition: A | Discharge status: Home / Self Care | Payer: BC Managed Care – PPO | Attending: Internal Medicine | Admitting: Internal Medicine

## 2021-07-12 ENCOUNTER — Encounter (HOSPITAL_BASED_OUTPATIENT_CLINIC_OR_DEPARTMENT_OTHER): Payer: Self-pay | Admitting: Emergency Medicine

## 2021-07-12 ENCOUNTER — Other Ambulatory Visit: Payer: Self-pay

## 2021-07-12 DIAGNOSIS — Z87891 Personal history of nicotine dependence: Secondary | ICD-10-CM

## 2021-07-12 DIAGNOSIS — E876 Hypokalemia: Secondary | ICD-10-CM | POA: Diagnosis present

## 2021-07-12 DIAGNOSIS — I5032 Chronic diastolic (congestive) heart failure: Secondary | ICD-10-CM | POA: Diagnosis present

## 2021-07-12 DIAGNOSIS — J45909 Unspecified asthma, uncomplicated: Secondary | ICD-10-CM | POA: Diagnosis present

## 2021-07-12 DIAGNOSIS — E861 Hypovolemia: Secondary | ICD-10-CM | POA: Diagnosis present

## 2021-07-12 DIAGNOSIS — R748 Abnormal levels of other serum enzymes: Secondary | ICD-10-CM | POA: Diagnosis present

## 2021-07-12 DIAGNOSIS — I110 Hypertensive heart disease with heart failure: Secondary | ICD-10-CM | POA: Diagnosis present

## 2021-07-12 DIAGNOSIS — H409 Unspecified glaucoma: Secondary | ICD-10-CM | POA: Diagnosis present

## 2021-07-12 DIAGNOSIS — N179 Acute kidney failure, unspecified: Secondary | ICD-10-CM

## 2021-07-12 DIAGNOSIS — Z79899 Other long term (current) drug therapy: Secondary | ICD-10-CM

## 2021-07-12 DIAGNOSIS — F32A Depression, unspecified: Secondary | ICD-10-CM | POA: Diagnosis present

## 2021-07-12 DIAGNOSIS — N12 Tubulo-interstitial nephritis, not specified as acute or chronic: Secondary | ICD-10-CM | POA: Diagnosis not present

## 2021-07-12 DIAGNOSIS — Z20822 Contact with and (suspected) exposure to covid-19: Secondary | ICD-10-CM | POA: Diagnosis present

## 2021-07-12 DIAGNOSIS — G4733 Obstructive sleep apnea (adult) (pediatric): Secondary | ICD-10-CM | POA: Diagnosis present

## 2021-07-12 DIAGNOSIS — I421 Obstructive hypertrophic cardiomyopathy: Secondary | ICD-10-CM | POA: Diagnosis present

## 2021-07-12 DIAGNOSIS — E871 Hypo-osmolality and hyponatremia: Secondary | ICD-10-CM | POA: Diagnosis present

## 2021-07-12 DIAGNOSIS — F419 Anxiety disorder, unspecified: Secondary | ICD-10-CM | POA: Diagnosis present

## 2021-07-12 DIAGNOSIS — Z91048 Other nonmedicinal substance allergy status: Secondary | ICD-10-CM

## 2021-07-12 DIAGNOSIS — Z9884 Bariatric surgery status: Secondary | ICD-10-CM | POA: Diagnosis not present

## 2021-07-12 DIAGNOSIS — N10 Acute tubulo-interstitial nephritis: Secondary | ICD-10-CM | POA: Diagnosis not present

## 2021-07-12 DIAGNOSIS — Z8249 Family history of ischemic heart disease and other diseases of the circulatory system: Secondary | ICD-10-CM

## 2021-07-12 DIAGNOSIS — N2881 Hypertrophy of kidney: Secondary | ICD-10-CM | POA: Diagnosis present

## 2021-07-12 LAB — COMPREHENSIVE METABOLIC PANEL
ALT: 16 U/L (ref 0–44)
AST: 11 U/L — ABNORMAL LOW (ref 15–41)
Albumin: 3.3 g/dL — ABNORMAL LOW (ref 3.5–5.0)
Alkaline Phosphatase: 215 U/L — ABNORMAL HIGH (ref 38–126)
Anion gap: 14 (ref 5–15)
BUN: 57 mg/dL — ABNORMAL HIGH (ref 6–20)
CO2: 25 mmol/L (ref 22–32)
Calcium: 9 mg/dL (ref 8.9–10.3)
Chloride: 83 mmol/L — ABNORMAL LOW (ref 98–111)
Creatinine, Ser: 2.38 mg/dL — ABNORMAL HIGH (ref 0.44–1.00)
GFR, Estimated: 24 mL/min — ABNORMAL LOW (ref >60)
Glucose, Bld: 104 mg/dL — ABNORMAL HIGH (ref 70–99)
Potassium: 3.3 mmol/L — ABNORMAL LOW (ref 3.5–5.1)
Sodium: 122 mmol/L — ABNORMAL LOW (ref 135–145)
Total Bilirubin: 0.9 mg/dL (ref 0.3–1.2)
Total Protein: 6.8 g/dL (ref 6.5–8.1)

## 2021-07-12 LAB — CBC
HCT: 38.7 % (ref 36.0–46.0)
HCT: 42.6 % (ref 36.0–46.0)
Hemoglobin: 14.4 g/dL (ref 12.0–15.0)
Hemoglobin: 15.3 g/dL — ABNORMAL HIGH (ref 12.0–15.0)
MCH: 32.5 pg (ref 26.0–34.0)
MCH: 33 pg (ref 26.0–34.0)
MCHC: 37.2 g/dL — ABNORMAL HIGH (ref 30.0–36.0)
MCHC: 35.9 g/dL (ref 30.0–36.0)
MCV: 87.4 fL (ref 80.0–100.0)
MCV: 92 fL (ref 80.0–100.0)
Platelets: 198 10*3/uL (ref 150–400)
Platelets: 211 10*3/uL (ref 150–400)
RBC: 4.43 MIL/uL (ref 3.87–5.11)
RBC: 4.63 MIL/uL (ref 3.87–5.11)
RDW: 11.9 % (ref 11.5–15.5)
RDW: 12.8 % (ref 11.5–15.5)
WBC: 21.4 10*3/uL — ABNORMAL HIGH (ref 4.0–10.5)
WBC: 22.3 10*3/uL — ABNORMAL HIGH (ref 4.0–10.5)
nRBC: 0 % (ref 0.0–0.2)
nRBC: 0 % (ref 0.0–0.2)

## 2021-07-12 LAB — BASIC METABOLIC PANEL
Anion gap: 15 (ref 5–15)
BUN: 57 mg/dL — ABNORMAL HIGH (ref 6–20)
CO2: 24 mmol/L (ref 22–32)
Calcium: 8.7 mg/dL — ABNORMAL LOW (ref 8.9–10.3)
Chloride: 85 mmol/L — ABNORMAL LOW (ref 98–111)
Creatinine, Ser: 2.18 mg/dL — ABNORMAL HIGH (ref 0.44–1.00)
GFR, Estimated: 26 mL/min — ABNORMAL LOW (ref >60)
Glucose, Bld: 111 mg/dL — ABNORMAL HIGH (ref 70–99)
Potassium: 3.4 mmol/L — ABNORMAL LOW (ref 3.5–5.1)
Sodium: 124 mmol/L — ABNORMAL LOW (ref 135–145)

## 2021-07-12 LAB — URINALYSIS, ROUTINE W REFLEX MICROSCOPIC
Bilirubin Urine: NEGATIVE
Glucose, UA: NEGATIVE mg/dL
Ketones, ur: NEGATIVE mg/dL
Nitrite: NEGATIVE
Protein, ur: 100 mg/dL — AB
Specific Gravity, Urine: 1.009 (ref 1.005–1.030)
pH: 6 (ref 5.0–8.0)

## 2021-07-12 LAB — CREATININE, SERUM
Creatinine, Ser: 2.42 mg/dL — ABNORMAL HIGH (ref 0.44–1.00)
GFR, Estimated: 23 mL/min — ABNORMAL LOW (ref >60)

## 2021-07-12 LAB — RESP PANEL BY RT-PCR (FLU A&B, COVID) ARPGX2
Influenza A by PCR: NEGATIVE
Influenza B by PCR: NEGATIVE
SARS Coronavirus 2 by RT PCR: NEGATIVE

## 2021-07-12 LAB — LIPASE, BLOOD: Lipase: 20 U/L (ref 11–51)

## 2021-07-12 LAB — LACTIC ACID, PLASMA
Lactic Acid, Venous: 1.2 mmol/L (ref 0.5–1.9)
Lactic Acid, Venous: 1 mmol/L (ref 0.5–1.9)

## 2021-07-12 MED ORDER — ENOXAPARIN SODIUM 30 MG/0.3ML IJ SOSY
30.0000 mg | PREFILLED_SYRINGE | INTRAMUSCULAR | Status: DC
  Administered 2021-07-12 – 2021-07-13 (×2): 30 mg via SUBCUTANEOUS
  Filled 2021-07-12 (×2): qty 0.3

## 2021-07-12 MED ORDER — SODIUM CHLORIDE 0.9 % IV SOLN
2.0000 g | Freq: Every day | INTRAVENOUS | Status: DC
  Administered 2021-07-13 – 2021-07-14 (×2): 2 g via INTRAVENOUS
  Filled 2021-07-12 (×2): qty 20

## 2021-07-12 MED ORDER — SODIUM CHLORIDE 0.9 % IV SOLN
1.0000 g | Freq: Once | INTRAVENOUS | Status: AC
  Administered 2021-07-12: 1 g via INTRAVENOUS
  Filled 2021-07-12: qty 10

## 2021-07-12 MED ORDER — METOPROLOL TARTRATE 25 MG PO TABS
12.5000 mg | ORAL_TABLET | Freq: Two times a day (BID) | ORAL | Status: DC
  Administered 2021-07-12 – 2021-07-13 (×2): 12.5 mg via ORAL
  Filled 2021-07-12 (×2): qty 1

## 2021-07-12 MED ORDER — POLYETHYLENE GLYCOL 3350 17 G PO PACK: 17.0000 g | PACK | Freq: Every day | ORAL | Status: DC | PRN

## 2021-07-12 MED ORDER — OXYCODONE HCL 5 MG PO TABS
5.0000 mg | ORAL_TABLET | Freq: Four times a day (QID) | ORAL | Status: DC | PRN
  Administered 2021-07-12: 5 mg via ORAL
  Filled 2021-07-12: qty 1

## 2021-07-12 MED ORDER — LACTATED RINGERS IV BOLUS
1000.0000 mL | Freq: Once | INTRAVENOUS | Status: AC
  Administered 2021-07-12: 1000 mL via INTRAVENOUS

## 2021-07-12 MED ORDER — ONDANSETRON HCL 4 MG/2ML IJ SOLN: 4.0000 mg | Freq: Four times a day (QID) | INTRAMUSCULAR | Status: DC | PRN

## 2021-07-12 MED ORDER — ACETAMINOPHEN 325 MG PO TABS
650.0000 mg | ORAL_TABLET | Freq: Four times a day (QID) | ORAL | Status: DC | PRN
  Administered 2021-07-13: 650 mg via ORAL
  Filled 2021-07-12: qty 2

## 2021-07-12 MED ORDER — METOPROLOL TARTRATE 5 MG/5ML IV SOLN: 2.5000 mg | Freq: Four times a day (QID) | INTRAVENOUS | Status: DC | PRN

## 2021-07-12 MED ORDER — HYDROMORPHONE HCL 1 MG/ML IJ SOLN
1.0000 mg | Freq: Once | INTRAMUSCULAR | Status: AC
Start: 2021-07-12 — End: 2021-07-12
  Administered 2021-07-12: 1 mg via INTRAVENOUS
  Filled 2021-07-12: qty 1

## 2021-07-12 MED ORDER — HYDROMORPHONE HCL 1 MG/ML IJ SOLN
1.0000 mg | INTRAMUSCULAR | Status: DC | PRN
Start: 2021-07-12 — End: 2021-07-14

## 2021-07-12 MED ORDER — POTASSIUM CHLORIDE CRYS ER 20 MEQ PO TBCR
20.0000 meq | EXTENDED_RELEASE_TABLET | Freq: Once | ORAL | Status: AC
  Administered 2021-07-12: 20 meq via ORAL
  Filled 2021-07-12: qty 1

## 2021-07-12 MED ORDER — MELATONIN 3 MG PO TABS
3.0000 mg | ORAL_TABLET | Freq: Every evening | ORAL | Status: DC | PRN
  Filled 2021-07-12: qty 1

## 2021-07-12 MED ORDER — ESCITALOPRAM OXALATE 20 MG PO TABS
20.0000 mg | ORAL_TABLET | Freq: Every day | ORAL | Status: DC
  Administered 2021-07-13 – 2021-07-14 (×2): 20 mg via ORAL
  Filled 2021-07-12 (×2): qty 1

## 2021-07-12 MED ORDER — SODIUM CHLORIDE 0.9 % IV SOLN: INTRAVENOUS | Status: DC

## 2021-07-12 NOTE — ED Provider Notes (Signed)
[place] EMERGENCY DEPT Provider Note   CSN: [id number] Arrival date & time: 07/12/21  1131     History Chief Complaint  Patient presents with   Back Pain   Abdominal Pain    Monica Marks is a 56 y.o. female.  HPI 55 year old female presents with back pain and urinary tract infection.  She has been dealing with this for about a week.  She been having urgency and discomfort when urinating as well as minimal urine output.  Feels an urgency all day.  Has been having nonspecific bilateral back pain sometimes rating to her abdomen.  She vomited earlier in the week.  She been taking Tylenol.  She has not noticed a fever.  Finally went to urgent care a couple days ago and was prescribed Keflex but given she is not a better she came to the emergency department for evaluation.  Pain is rated as a 7.  Past Medical History:  Diagnosis Date   Anxiety    Asthma    bring inhaler dos   Fibroid    Glaucoma    Headache(784.0)    HOCM (hypertrophic obstructive cardiomyopathy) ([place])    Hypertension    LVH (left ventricular hypertrophy)    TTE showed severe LVH with SAM & a resting LVOT gradient of 71mmHg which increased to 70mmHg with Valsalva    Sleep apnea    cpap   SOB (shortness of breath) on exertion    During moderate exercise    Patient Active Problem List   Diagnosis Date Noted   Obesity 10/31/2016   GI bleed 06/09/2015   Acute blood loss anemia 06/09/2015   Asthma 06/09/2015   Dyspnea 08/12/2014   Palpitations 08/12/2014   Hypertrophic obstructive cardiomyopathy (HOCM) ([place]) 08/28/2013   Essential hypertension 08/28/2013   Endometriosis 08/23/2012    Past Surgical History:  Procedure Laterality Date   ABDOMINAL HYSTERECTOMY  07/30/2012   Procedure: HYSTERECTOMY ABDOMINAL;  Surgeon: Bennetta Laos, MD;  Location: [facility];  Service: Gynecology;  Laterality: N/A;   ABDOMINOPLASTY  2006   BREAST SURGERY  2006   BREAST LIFT   CESAREAN SECTION     3  TIMES   COLONOSCOPY W/ POLYPECTOMY  05/27/2015   With biopsy   LAPAROSCOPIC GASTRIC SLEEVE RESECTION WITH HIATAL HERNIA REPAIR N/A 10/31/2016   Procedure: LAPAROSCOPIC GASTRIC SLEEVE RESECTION WITH HIATAL HERNIA REPAIR, UPPER ENDO;  Surgeon: Mickeal Skinner, MD;  Location: [facility];  Service: General;  Laterality: N/A;   LAPAROSCOPY  07/30/2012   Procedure: LAPAROSCOPY DIAGNOSTIC;  Surgeon: Bennetta Laos, MD;  Location: [facility];  Service: Gynecology;  Laterality: N/A;  attempted   SALPINGOOPHORECTOMY  07/30/2012   Procedure: SALPINGO OOPHORECTOMY;  Surgeon: Bennetta Laos, MD;  Location: [facility];  Service: Gynecology;  Laterality: Right;   UPPER GI ENDOSCOPY  10/31/2016   Procedure: UPPER GI ENDOSCOPY;  Surgeon: Arta Bruce Kinsinger, MD;  Location: [facility];  Service: General;;     OB History     Gravida  5   Para  3   Term  3   Preterm      AB      Living  3      SAB      IAB      Ectopic      Multiple      Live Births              Family History  Problem Relation Age of Onset  Hypertension Mother    Cancer Mother    Lung cancer Father    Melanoma Father    Heart disease Father        HCM, s/p ICD implant   Stroke Other     Social History   Tobacco Use   Smoking status: Former    Packs/day: 1.00    Types: Cigarettes    Quit date: 05/25/2008    Years since quitting: 13.1   Smokeless tobacco: Never  Vaping Use   Vaping Use: Never used  Substance Use Topics   Alcohol use: Yes    Comment: occasional   Drug use: No    Home Medications Prior to Admission medications   Medication Sig Start Date End Date Taking? Authorizing Provider  ALPRAZolam Duanne Moron) 0.25 MG tablet Take 1 tablet by mouth 2 (two) times daily as needed for anxiety.  10/05/16   [provider]  calcium-vitamin D (OSCAL WITH D) 500-200 MG-UNIT tablet Take 1 tablet by mouth 3 (three) times daily.    [provider]  escitalopram (LEXAPRO) 20 MG tablet Take 20 mg  by mouth daily. 01/16/21   [provider]  latanoprost (XALATAN) 0.005 % ophthalmic solution Place 1 drop into both eyes at bedtime.    [provider]  metoprolol tartrate (LOPRESSOR) 50 MG tablet Take 1.5 tablets (75 mg total) by mouth 2 (two) times daily. 04/19/21   Shirley Friar, PA-C  montelukast (SINGULAIR) 10 MG tablet Take 10 mg by mouth daily. 06/22/18   [provider]  Multiple Vitamins-Minerals (WOMENS MULTI VITAMIN & MINERAL PO) Take 1 tablet by mouth daily.    [provider]  omeprazole (PRILOSEC) 20 MG capsule Take 1 capsule (20 mg total) by mouth daily. 11/01/16   Kinsinger, Arta Bruce, MD    Allergies    Tape  Review of Systems   Review of Systems  Constitutional:  Negative for fever.  Gastrointestinal:  Positive for abdominal pain and vomiting. Negative for diarrhea.  Genitourinary:  Positive for dysuria and urgency.  Musculoskeletal:  Positive for back pain.  All other systems reviewed and are negative.  Physical Exam Updated Vital Signs BP 124/75   Pulse 80   Temp 97.9 F (36.6 C) (Oral)   Resp 18   Ht 5\' 5"  (1.651 m)   Wt 75.8 kg   LMP 07/30/2012   SpO2 96%   BMI 27.79 kg/m   Physical Exam Vitals and nursing note reviewed.  Constitutional:      General: She is not in acute distress.    Appearance: She is well-developed. She is not ill-appearing or diaphoretic.  HENT:     Head: Normocephalic and atraumatic.     Right Ear: External ear normal.     Left Ear: External ear normal.     Nose: Nose normal.  Eyes:     General:        Right eye: No discharge.        Left eye: No discharge.  Cardiovascular:     Rate and Rhythm: Normal rate and regular rhythm.     Heart sounds: Normal heart sounds.  Pulmonary:     Effort: Pulmonary effort is normal.     Breath sounds: Normal breath sounds.  Abdominal:     Palpations: Abdomen is soft.     Tenderness: There is abdominal tenderness (mild, generalized). There is  right CVA tenderness.  Skin:    General: Skin is warm and dry.  Neurological:  Mental Status: She is alert.  Psychiatric:        Mood and Affect: Mood is not anxious.    ED Results / Procedures / Treatments   Labs (all labs ordered are listed, but only abnormal results are displayed) Labs Reviewed  COMPREHENSIVE METABOLIC PANEL - Abnormal; Notable for the following components:      Result Value   Sodium 122 (*)    Potassium 3.3 (*)    Chloride 83 (*)    Glucose, Bld 104 (*)    BUN 57 (*)    Creatinine, Ser 2.38 (*)    Albumin 3.3 (*)    AST 11 (*)    Alkaline Phosphatase 215 (*)    GFR, Estimated 24 (*)    All other components within normal limits  CBC - Abnormal; Notable for the following components:   WBC 21.4 (*)    MCHC 37.2 (*)    All other components within normal limits  URINALYSIS, ROUTINE W REFLEX MICROSCOPIC - Abnormal; Notable for the following components:   Hgb urine dipstick SMALL (*)    Protein, ur 100 (*)    Leukocytes,Ua MODERATE (*)    Bacteria, UA FEW (*)    Non Squamous Epithelial 0-5 (*)    All other components within normal limits  CULTURE, BLOOD (ROUTINE X 2)  CULTURE, BLOOD (ROUTINE X 2)  LIPASE, BLOOD  LACTIC ACID, PLASMA  LACTIC ACID, PLASMA    EKG None  Radiology No results found.  Procedures Procedures   Medications Ordered in ED Medications  cefTRIAXone (ROCEPHIN) 1 g in sodium chloride 0.9 % 100 mL IVPB (0 g Intravenous Stopped 07/12/21 1542)  lactated ringers bolus 1,000 mL (1,000 mLs Intravenous New Bag/Given 07/12/21 1459)  HYDROmorphone (DILAUDID) injection 1 mg (1 mg Intravenous Given 07/12/21 1500)    ED Course  I have reviewed the triage vital signs and the nursing notes.  Pertinent labs & imaging results that were available during my care of the patient were reviewed by me and considered in my medical decision making (see chart for details).    MDM Rules/Calculators/A&P                           Patient is  well appearing with stable vitals but labs show leukocytosis and AKI. Will get CT to rule out obstruction such as a stone. Will give fluids and IV rocephin. Will need admission after CT. Care to Dr. Melina Copa.  Final Clinical Impression(s) / ED Diagnoses Final diagnoses:  None    Rx / DC Orders ED Discharge Orders     None        Sherwood Gambler, MD 07/12/21 1601

## 2021-07-12 NOTE — H&P (Signed)
History and Physical  Monica Marks:[id number] DOB: 12-Nov-1965 DOA: 07/12/2021  Referring physician: Accepted by Dr. Sabino Marks, direct admit from [facility] ED. PCP: Monica Pepper, MD  Outpatient Specialists: Cardiology, gynecology. Patient coming from: Home through [facility] ED  Chief Complaint: Back pain, abdominal pain.  HPI: Monica Marks is a 55 y.o. female with medical history significant for HCM, gastric sleeve 15 years ago, who presented to [facility] Med[facility]urg unit as a direct admit from [facility] ED, accepted by Dr. Sabino Marks.  She initially presented to the ED with complaints of bilateral flank pain sometimes radiating to her abdomen x 5 days.  Associated with urinary urgency, some retention and urinary discomfort.  Endorses emesis x2 at the beginning of the week.  She has been taking Tylenol for the pain.  Denies fevers or chills.  She went to urgent care 2 days ago and was prescribed Keflex.  Due to no improvement of her symptomatology, she went to the ED for further evaluation.  Upon presentation to the ED, urine analysis was positive for pyuria, CT renal revealed mildly enlarged bilateral kidneys with perinephric fat stranding.  Findings concerning for bilateral pyelonephritis.  No hydronephrosis or urinary tract calculi.  Cholelithiasis, 2 mm right solid pulmonary nodule.  Patient was accepted by hospitalist service and transferred to [facility].  ED Course:  Temperature 98.2.  BP 177/94, pulse 88, respiration rate 15, O2 saturation 98% on room air.  Lab studies remarkable for serum sodium 122, potassium 3.3, BUN 57, creatinine 2.38, alkaline phosphatase 215, albumin 3.3, lactic acid 1.7.  WBC 21.4, hemoglobin 14.4, platelet count 198.  Review of Systems: Review of systems as noted in the HPI. All other systems reviewed and are negative.   Past Medical History:  Diagnosis Date   Anxiety    Asthma    bring inhaler dos   Fibroid    Glaucoma    Headache(784.0)    HOCM  (hypertrophic obstructive cardiomyopathy) ([place])    Hypertension    LVH (left ventricular hypertrophy)    TTE showed severe LVH with SAM & a resting LVOT gradient of 13mmHg which increased to 40mmHg with Valsalva    Sleep apnea    cpap   SOB (shortness of breath) on exertion    During moderate exercise   Past Surgical History:  Procedure Laterality Date   ABDOMINAL HYSTERECTOMY  07/30/2012   Procedure: HYSTERECTOMY ABDOMINAL;  Surgeon: Bennetta Laos, MD;  Location: [facility];  Service: Gynecology;  Laterality: N/A;   ABDOMINOPLASTY  2006   BREAST SURGERY  2006   BREAST LIFT   CESAREAN SECTION     3 TIMES   COLONOSCOPY W/ POLYPECTOMY  05/27/2015   With biopsy   LAPAROSCOPIC GASTRIC SLEEVE RESECTION WITH HIATAL HERNIA REPAIR N/A 10/31/2016   Procedure: LAPAROSCOPIC GASTRIC SLEEVE RESECTION WITH HIATAL HERNIA REPAIR, UPPER ENDO;  Surgeon: Mickeal Skinner, MD;  Location: [facility];  Service: General;  Laterality: N/A;   LAPAROSCOPY  07/30/2012   Procedure: LAPAROSCOPY DIAGNOSTIC;  Surgeon: Bennetta Laos, MD;  Location: [facility];  Service: Gynecology;  Laterality: N/A;  attempted   SALPINGOOPHORECTOMY  07/30/2012   Procedure: SALPINGO OOPHORECTOMY;  Surgeon: Bennetta Laos, MD;  Location: [facility];  Service: Gynecology;  Laterality: Right;   UPPER GI ENDOSCOPY  10/31/2016   Procedure: UPPER GI ENDOSCOPY;  Surgeon: Arta Bruce Kinsinger, MD;  Location: [facility];  Service: General;;    Social History:  reports that she quit smoking about 13 years ago.  Her smoking use included cigarettes. She smoked an average of 1 pack per day. She has never used smokeless tobacco. She reports current alcohol use. She reports that she does not use drugs.   Allergies  Allergen Reactions   Tape Itching and Rash    Plastic tape/adhesives   Paper tape is ok    Family History  Problem Relation Age of Onset   Hypertension Mother    Cancer Mother    Lung cancer Father    Melanoma Father    Heart  disease Father        HCM, s/p ICD implant   Stroke Other       Prior to Admission medications   Medication Sig Start Date End Date Taking? Authorizing Provider  ALPRAZolam Duanne Moron) 0.25 MG tablet Take 1 tablet by mouth 2 (two) times daily as needed for anxiety.  10/05/16   [provider]  calcium-vitamin D (OSCAL WITH D) 500-200 MG-UNIT tablet Take 1 tablet by mouth 3 (three) times daily.    [provider]  escitalopram (LEXAPRO) 20 MG tablet Take 20 mg by mouth daily. 01/16/21   [provider]  latanoprost (XALATAN) 0.005 % ophthalmic solution Place 1 drop into both eyes at bedtime.    [provider]  metoprolol tartrate (LOPRESSOR) 50 MG tablet Take 1.5 tablets (75 mg total) by mouth 2 (two) times daily. 04/19/21   Shirley Friar, PA-C  montelukast (SINGULAIR) 10 MG tablet Take 10 mg by mouth daily. 06/22/18   [provider]  Multiple Vitamins-Minerals (WOMENS MULTI VITAMIN & MINERAL PO) Take 1 tablet by mouth daily.    [provider]  omeprazole (PRILOSEC) 20 MG capsule Take 1 capsule (20 mg total) by mouth daily. 11/01/16   Kinsinger, Arta Bruce, MD    Physical Exam: BP (!) 177/94 (BP Location: Right Arm)   Pulse 88   Temp 98.2 F (36.8 C) (Oral)   Resp 15   Ht 5\' 5"  (1.651 m)   Wt 75.8 kg   LMP 07/30/2012   SpO2 98%   BMI 27.79 kg/m   General: 55 y.o. year-old female well developed well nourished in no acute distress.  Alert and oriented x3. Cardiovascular: Regular rate and rhythm with no rubs or gallops.  No thyromegaly or JVD noted.  No lower extremity edema. 2/4 pulses in all 4 extremities. Respiratory: Clear to auscultation with no wheezes or rales. Good inspiratory effort. Abdomen: Soft nontender nondistended with normal bowel sounds x4 quadrants. Muskuloskeletal: No cyanosis, clubbing or edema noted bilaterally Neuro: CN II-XII intact, strength, sensation, reflexes Skin: No ulcerative lesions noted or  rashes Psychiatry: Judgement and insight appear normal. Mood is appropriate for condition and setting          Labs on Admission:  Basic Metabolic Panel: Recent Labs  Lab 07/12/21 1203  NA 122*  K 3.3*  CL 83*  CO2 25  GLUCOSE 104*  BUN 57*  CREATININE 2.38*  CALCIUM 9.0   Liver Function Tests: Recent Labs  Lab 07/12/21 1203  AST 11*  ALT 16  ALKPHOS 215*  BILITOT 0.9  PROT 6.8  ALBUMIN 3.3*   Recent Labs  Lab 07/12/21 1203  LIPASE 20   No results for input(s): AMMONIA in the last 168 hours. CBC: Recent Labs  Lab 07/12/21 1203  WBC 21.4*  HGB 14.4  HCT 38.7  MCV 87.4  PLT 198   Cardiac Enzymes: No results for input(s): CKTOTAL, CKMB, CKMBINDEX, TROPONINI in the last 168  hours.  BNP (last 3 results) No results for input(s): BNP in the last 8760 hours.  ProBNP (last 3 results) No results for input(s): PROBNP in the last 8760 hours.  CBG: No results for input(s): GLUCAP in the last 168 hours.  Radiological Exams on Admission: CT Renal Stone Study  Result Date: 07/12/2021 CLINICAL DATA:  UTI.  Back pain. EXAM: CT ABDOMEN AND PELVIS WITHOUT CONTRAST TECHNIQUE: Multidetector CT imaging of the abdomen and pelvis was performed following the standard protocol without IV contrast. COMPARISON:  Right upper quadrant ultrasound 12/26/2019. FINDINGS: Lower chest: There is a 2 mm nodular density in the right middle lobe image 4/1. Hepatobiliary: Small gallstones are present. There is no bile duct dilatation. The liver appears within normal limits. Pancreas: Unremarkable. No pancreatic ductal dilatation or surrounding inflammatory changes. Spleen: There is some capsular calcifications. The spleen is otherwise within normal limits. Adrenals/Urinary Tract: The bilateral adrenal glands and bladder appear within normal limits. There is mild bilateral perinephric fat stranding. Kidneys appear mildly enlarged bilaterally. No urinary tract calculi. No perinephric fluid  collection. Stomach/Bowel: There are postsurgical changes in the stomach. Appendix appears normal. No evidence of bowel wall thickening, distention, or inflammatory changes. Vascular/Lymphatic: Aortic atherosclerosis. No enlarged abdominal or pelvic lymph nodes. There are nonenlarged retroperitoneal lymph nodes. Reproductive: Status post hysterectomy. No adnexal masses. Other: No abdominal wall hernia or abnormality. No abdominopelvic ascites. There is some scarring in the anterior lower abdomen. Musculoskeletal: No acute or significant osseous findings. IMPRESSION: 1. Mildly enlarged bilateral kidneys with perinephric fat stranding. Findings are concerning for bilateral pyelonephritis. No hydronephrosis or urinary tract calculi. 2.  Cholelithiasis. 3. 2 mm right solid pulmonary nodule. No routine follow-up imaging is recommended per Fleischner Society Guidelines. These guidelines do not apply to immunocompromised patients and patients with cancer. Follow up in patients with significant comorbidities as clinically warranted. For lung cancer screening, adhere to Lung-RADS guidelines. Reference: Radiology. 2017; 284(1):228-43. Electronically Signed   By: Ronney Asters M.D.   On: 07/12/2021 15:58    EKG: I independently viewed the EKG done and my findings are as followed: None available at time of dictation.  Ordered and pending.  Assessment/Plan Present on Admission:  Pyelonephritis  Active Problems:   Pyelonephritis  Bilateral pyelonephritis, POA Presented with bilateral flank pain of 5 days duration, WBC 21.4K. Associated with nausea and vomiting x2. UA positive for pyuria on admission, CT renal suggestive of bilateral pyelonephritis. Urine culture and blood cultures x2 peripherally ordered Follow cultures for ID and sensitivities Started on Rocephin empirically, continue Add normal saline at 75 cc/h x 2 days. Monitor fever curve and WBC.  Hypertrophic cardiomyopathy/chronic diastolic  CHF Follows with cardiology outpatient Euvolemic on exam Closely monitor volume status while on IV fluid hydration. Last 2D echo done on 05/04/2021 showed LVEF greater than 75%, the left ventricle has hyperdynamic function.  Severe asymmetric left ventricular hypertrophy of the basal septal segment.  Grade 2 diastolic dysfunction.  The left atrial size severely dilated, the right atrial size was moderately dilated. Start strict I's and O's and daily weight  AKI, suspect prerenal in the setting of dehydration from poor intake Baseline creatinine appears to be 0.7 with GFR greater than 60 Presented with creatinine of 2.38 with GFR of 24. Avoid nephrotoxic agents, dehydration and hypotension Continue IV fluid hydration normal saline at 75 cc/h Closely monitor urine output with strict I's and O's Repeat BMP  Hypovolemic hyponatremia Serum sodium 122 Repeat BMP in Continue normal saline at 75 cc/h,  avoid quick correction of serum sodium, no more than 8 mEq in 24 hours.  Essential hypertension BP is not at goal, elevated Resume home oral antihypertensives IV antihypertensives as needed with parameters  Hypokalemia Serum potassium 3.3 Repleted orally judiciously due to AKI with KCl 20 mill equivalent x1 dose.  Elevated alkaline phosphatase, nonspecific Alkaline phosphatase 215 Continue to monitor  Chronic anxiety/depression Resume home Lexapro   DVT prophylaxis: Subcu Lovenox daily  Code Status: Full code  Family Communication: None at bedside  Disposition Plan: Direct admit from droppage ED  Consults called: None  Admission status: Inpatient status.  Patient will require at least 2 midnights for further evaluation and treatment of present condition.   Status is: Inpatient         Kayleen Memos MD [facility] Pager [phone]  If 7PM-7AM, please contact night-coverage [email or web address] Password [facility]  07/12/2021, 8:47 PM

## 2021-07-12 NOTE — ED Triage Notes (Signed)
Dx with UTI Saturday. States symptoms continue with back pain radiating into her abd.

## 2021-07-12 NOTE — ED Notes (Signed)
Patient transported to CT 

## 2021-07-12 NOTE — ED Provider Notes (Signed)
Signout from Dr. Ardith Dark.  55 year old female with back pain and urinary symptoms.  Labs showing an AKI and hyponatremia.  She is pending a CT renal.  Will need admission to the hospital. Physical Exam  BP 124/75   Pulse 80   Temp 97.9 F (36.6 C) (Oral)   Resp 18   Ht 5\' 5"  (1.651 m)   Wt 75.8 kg   LMP 07/30/2012   SpO2 96%   BMI 27.79 kg/m   Physical Exam  ED Course/Procedures     Procedures  MDM  CT does not show any obstructive process.  Discussed with [facility] hospitalist Dr. Sabino Gasser who will put the bed in for admission.       Hayden Rasmussen, MD 07/13/21 [id number]

## 2021-07-13 LAB — BASIC METABOLIC PANEL
Anion gap: 15 (ref 5–15)
BUN: 54 mg/dL — ABNORMAL HIGH (ref 6–20)
CO2: 21 mmol/L — ABNORMAL LOW (ref 22–32)
Calcium: 8.4 mg/dL — ABNORMAL LOW (ref 8.9–10.3)
Chloride: 89 mmol/L — ABNORMAL LOW (ref 98–111)
Creatinine, Ser: 2.27 mg/dL — ABNORMAL HIGH (ref 0.44–1.00)
GFR, Estimated: 25 mL/min — ABNORMAL LOW (ref >60)
Glucose, Bld: 107 mg/dL — ABNORMAL HIGH (ref 70–99)
Potassium: 3.6 mmol/L (ref 3.5–5.1)
Sodium: 125 mmol/L — ABNORMAL LOW (ref 135–145)

## 2021-07-13 LAB — COMPREHENSIVE METABOLIC PANEL
ALT: 14 U/L (ref 0–44)
AST: 8 U/L — ABNORMAL LOW (ref 15–41)
Albumin: 2.5 g/dL — ABNORMAL LOW (ref 3.5–5.0)
Alkaline Phosphatase: 173 U/L — ABNORMAL HIGH (ref 38–126)
Anion gap: 12 (ref 5–15)
BUN: 59 mg/dL — ABNORMAL HIGH (ref 6–20)
CO2: 23 mmol/L (ref 22–32)
Calcium: 8.1 mg/dL — ABNORMAL LOW (ref 8.9–10.3)
Chloride: 88 mmol/L — ABNORMAL LOW (ref 98–111)
Creatinine, Ser: 2.27 mg/dL — ABNORMAL HIGH (ref 0.44–1.00)
GFR, Estimated: 25 mL/min — ABNORMAL LOW (ref >60)
Glucose, Bld: 104 mg/dL — ABNORMAL HIGH (ref 70–99)
Potassium: 3.5 mmol/L (ref 3.5–5.1)
Sodium: 123 mmol/L — ABNORMAL LOW (ref 135–145)
Total Bilirubin: 0.9 mg/dL (ref 0.3–1.2)
Total Protein: 5.9 g/dL — ABNORMAL LOW (ref 6.5–8.1)

## 2021-07-13 LAB — URINE CULTURE: Culture: NO GROWTH

## 2021-07-13 LAB — CBC
HCT: 36.2 % (ref 36.0–46.0)
Hemoglobin: 13 g/dL (ref 12.0–15.0)
MCH: 33.1 pg (ref 26.0–34.0)
MCHC: 35.9 g/dL (ref 30.0–36.0)
MCV: 92.1 fL (ref 80.0–100.0)
Platelets: 208 10*3/uL (ref 150–400)
RBC: 3.93 MIL/uL (ref 3.87–5.11)
RDW: 12.9 % (ref 11.5–15.5)
WBC: 18.2 10*3/uL — ABNORMAL HIGH (ref 4.0–10.5)
nRBC: 0 % (ref 0.0–0.2)

## 2021-07-13 LAB — PHOSPHORUS: Phosphorus: 5.1 mg/dL — ABNORMAL HIGH (ref 2.5–4.6)

## 2021-07-13 LAB — HIV ANTIBODY (ROUTINE TESTING W REFLEX): HIV Screen 4th Generation wRfx: NONREACTIVE

## 2021-07-13 LAB — MAGNESIUM: Magnesium: 1.7 mg/dL (ref 1.7–2.4)

## 2021-07-13 MED ORDER — PANTOPRAZOLE 2 MG/ML SUSPENSION
40.0000 mg | Freq: Once | ORAL | Status: AC
  Administered 2021-07-13: 40 mg via ORAL
  Filled 2021-07-13 (×2): qty 20

## 2021-07-13 MED ORDER — METOPROLOL TARTRATE 50 MG PO TABS
62.5000 mg | ORAL_TABLET | Freq: Once | ORAL | Status: AC
  Administered 2021-07-13: 62.5 mg via ORAL
  Filled 2021-07-13: qty 1

## 2021-07-13 MED ORDER — OMEPRAZOLE 2 MG/ML ORAL SUSPENSION: 20.0000 mg | Freq: Once | ORAL | Status: DC

## 2021-07-13 MED ORDER — POTASSIUM CHLORIDE CRYS ER 20 MEQ PO TBCR
20.0000 meq | EXTENDED_RELEASE_TABLET | Freq: Once | ORAL | Status: AC
  Administered 2021-07-13: 20 meq via ORAL
  Filled 2021-07-13: qty 1

## 2021-07-13 MED ORDER — METOPROLOL TARTRATE 50 MG PO TABS
75.0000 mg | ORAL_TABLET | Freq: Two times a day (BID) | ORAL | Status: DC
  Administered 2021-07-13 – 2021-07-14 (×2): 75 mg via ORAL
  Filled 2021-07-13 (×2): qty 1

## 2021-07-13 NOTE — Plan of Care (Signed)
Monica Marks reports feeling much better today compared to admission. She has had no further flank pain and her appetite has improved. She denies any dysuria or hematuria. Afebrile. She has been ambulating independently in her room.  Problem: Education: Goal: Knowledge of General Education information will improve Description: Including pain rating scale, medication(s)/side effects and non-pharmacologic comfort measures Outcome: Progressing   Problem: Nutrition: Goal: Adequate nutrition will be maintained Outcome: Progressing Note: Pt reports appetite has improved.    Problem: Elimination: Goal: Will not experience complications related to bowel motility Outcome: Progressing Goal: Will not experience complications related to urinary retention Outcome: Progressing   Problem: Safety: Goal: Ability to remain free from injury will improve Outcome: Progressing

## 2021-07-13 NOTE — Progress Notes (Signed)
PROGRESS NOTE  Monica Marks  BZJ:[id number] DOB: 04-28-1966 DOA: 07/12/2021 PCP: London Pepper, MD   Brief Narrative: Monica Marks is a 55 y.o. female with a history of HOCM, OSA, gastric sleeve surgery, HTN, and anxiety who presented to the ED with 5 days of worsening bilateral flank pain, chills, poor appetite and po intake, and urinary hesitancy, dysuria. These symptoms continued despite having taken keflex 500mg  twice daily for 4 doses prescribed by urgent care. In the ED she was afebrile, hypertensive, with SCr up to 2.38 from baseline of 0.7, WBC 21.4k. Urinalysis demonstrated pyuria with WBC clumps, few bacteria, amorphous crystals, and 6-10 RBCs/HPF. Renal CT revealed mild bilateral renal enlargement with perinephric fat stranding concerning for pyelonephritis. IV antibiotics and IVF fluids given, patient admitted for bilateral pyelonephritis complicated by renal failure that failed outpatient management.  Assessment & Plan: Active Problems:   Pyelonephritis  Acute bilateral pyelonephritis: Failed outpatient keflex.  - Continue ceftriaxone, trend leukocytosis and fever curve. Symptoms have begun improving.  - Monitor urine culture and blood cultures (both NGTD at this time).   AKI: Stable. No hydronephrosis or calculi noted on CT renal stone study. - Check urine studies.  - Continue IV fluids until po intake is confirmed to be adequate. Currently euvolemic.   Hyponatremia: Roughly stable 122 > 123, presumably hypovolemic - Continue isotonic saline, recheck this PM.  Hypokalemia: Resolved with supplementation.   Chronic HFpEF, hypertrophic cardiomyopathy: Appears asymptomatic - Continue metoprolol as below  HTN:  - Continue home metoprolol. BP is up.  Depression/anxiety: Quiescent.  - Continue SSRI  DVT prophylaxis: Lovenox 30mg  q24h Code Status: Full Family Communication: None at bedside Disposition Plan:  Status is: Inpatient  Remains inpatient appropriate because:  Requires IV antibiotics and IV fluids  Consultants:  None  Procedures:  None  Antimicrobials: Ceftriaxone   Subjective: Pain in bilateral flanks is significantly improved over the past 24 hours. Felt some chills, no fevers, no N/V/D. Ate a little breakfast for the first time in about a week.  Objective: Vitals:   07/13/21 0547 07/13/21 0757 07/13/21 0759 07/13/21 1019  BP: 140/85 (!) 172/91 (!) 158/78 (!) 142/80  Pulse: 93 98 (!) 101 96  Resp: 16 20    Temp: 98.8 F (37.1 C) 98.4 F (36.9 C)  98.6 F (37 C)  TempSrc: Oral Oral  Oral  SpO2: 95% 99%  98%  Weight:      Height:        Intake/Output Summary (Last 24 hours) at 07/13/2021 1107 Last data filed at 07/13/2021 1000 Gross per 24 hour  Intake 2245.76 ml  Output 450 ml  Net 1795.76 ml   Filed Weights   07/12/21 1159  Weight: 75.8 kg    Gen: 55 y.o. female in no distress  Pulm: Non-labored breathing room air. Clear to auscultation bilaterally.  CV: Regular rate and rhythm, II/VI systolic murmur at base. No other murmur, rub, or gallop. No JVD, no pitting pedal edema. GI: Abdomen soft, non-tender, non-distended, with normoactive bowel sounds. No organomegaly or masses felt. Ext: Warm, no deformities Skin: No rashes, lesions or ulcers Neuro: Alert and oriented. No focal neurological deficits. Psych: Judgement and insight appear normal. Mood & affect appropriate.   Data Reviewed: I have personally reviewed following labs and imaging studies  CBC: Recent Labs  Lab 07/12/21 1203 07/12/21 2028 07/13/21 0430  WBC 21.4* 22.3* 18.2*  HGB 14.4 15.3* 13.0  HCT 38.7 42.6 36.2  MCV 87.4 92.0 92.1  PLT 198  211 295   Basic Metabolic Panel: Recent Labs  Lab 07/12/21 1203 07/12/21 2028 07/12/21 2042 07/13/21 0430  NA 122*  --  124* 123*  K 3.3*  --  3.4* 3.5  CL 83*  --  85* 88*  CO2 25  --  24 23  GLUCOSE 104*  --  111* 104*  BUN 57*  --  57* 59*  CREATININE 2.38* 2.42* 2.18* 2.27*  CALCIUM 9.0  --  8.7*  8.1*  MG  --   --   --  1.7  PHOS  --   --   --  5.1*   GFR: Estimated Creatinine Clearance: 29.2 mL/min (A) (by C-G formula based on SCr of 2.27 mg/dL (H)). Liver Function Tests: Recent Labs  Lab 07/12/21 1203 07/13/21 0430  AST 11* 8*  ALT 16 14  ALKPHOS 215* 173*  BILITOT 0.9 0.9  PROT 6.8 5.9*  ALBUMIN 3.3* 2.5*   Recent Labs  Lab 07/12/21 1203  LIPASE 20   No results for input(s): AMMONIA in the last 168 hours. Coagulation Profile: No results for input(s): INR, PROTIME in the last 168 hours. Cardiac Enzymes: No results for input(s): CKTOTAL, CKMB, CKMBINDEX, TROPONINI in the last 168 hours. BNP (last 3 results) No results for input(s): PROBNP in the last 8760 hours. HbA1C: No results for input(s): HGBA1C in the last 72 hours. CBG: No results for input(s): GLUCAP in the last 168 hours. Lipid Profile: No results for input(s): CHOL, HDL, LDLCALC, TRIG, CHOLHDL, LDLDIRECT in the last 72 hours. Thyroid Function Tests: No results for input(s): TSH, T4TOTAL, FREET4, T3FREE, THYROIDAB in the last 72 hours. Anemia Panel: No results for input(s): VITAMINB12, FOLATE, FERRITIN, TIBC, IRON, RETICCTPCT in the last 72 hours. Urine analysis:    Component Value Date/Time   COLORURINE YELLOW 07/12/2021 [place] 07/12/2021 1203   LABSPEC 1.009 07/12/2021 1203   PHURINE 6.0 07/12/2021 1203   GLUCOSEU NEGATIVE 07/12/2021 1203   HGBUR SMALL (A) 07/12/2021 1203   BILIRUBINUR NEGATIVE 07/12/2021 [place] 07/12/2021 1203   PROTEINUR 100 (A) 07/12/2021 1203   UROBILINOGEN 0.2 06/09/2015 1930   NITRITE NEGATIVE 07/12/2021 1203   LEUKOCYTESUR MODERATE (A) 07/12/2021 1203   Recent Results (from the past 240 hour(s))  Culture, blood (routine x 2)     Status: None (Preliminary result)   Collection Time: 07/12/21  2:40 PM   Specimen: Right Antecubital; Blood  Result Value Ref Range Status   Specimen Description   Final    RIGHT  ANTECUBITAL Performed at [facility], [address], [place], [place] [address]    Special Requests   Final    Blood Culture adequate volume Performed at [facility], [address], [place], [place] [address]    Culture   Final    NO GROWTH < 12 HOURS Performed at [facility], [place] [address], [place], [place] [address]    Report Status PENDING  Incomplete  Culture, blood (routine x 2)     Status: None (Preliminary result)   Collection Time: 07/12/21  3:00 PM   Specimen: Left Antecubital; Blood  Result Value Ref Range Status   Specimen Description   Final    LEFT ANTECUBITAL Performed at [facility], [address] [place], [place] [address]    Special Requests   Final    Blood Culture adequate volume Performed at [facility], [address] [place],  [address]  Culture   Final    NO GROWTH < 12 HOURS Performed at [facility], [place] [address] [place], [place] [address]    Report Status PENDING  Incomplete  Resp Panel by RT-PCR (Flu A&B, Covid) Nasopharyngeal Swab     Status: None   Collection Time: 07/12/21  4:04 PM   Specimen: Nasopharyngeal Swab; Nasopharyngeal(NP) swabs in vial transport medium  Result Value Ref Range Status   SARS Coronavirus 2 by RT PCR NEGATIVE NEGATIVE Final    Comment: (NOTE) SARS-CoV-2 target nucleic acids are NOT DETECTED.  The SARS-CoV-2 RNA is generally detectable in upper respiratory specimens during the acute phase of infection. The lowest concentration of SARS-CoV-2 viral copies this assay can detect is 138 copies/mL. A negative result does not preclude SARS-Cov-2 infection and should not be used as the sole basis for treatment or other patient management decisions. A negative result may occur with  improper specimen collection/handling, submission of specimen other than nasopharyngeal swab, presence of viral mutation(s) within  the areas targeted by this assay, and inadequate number of viral copies(<138 copies/mL). A negative result must be combined with clinical observations, patient history, and epidemiological information. The expected result is Negative.  Fact Sheet for Patients:  EntrepreneurPulse.com.au  Fact Sheet for Healthcare Providers:  IncredibleEmployment.be  This test is no t yet approved or cleared by the [place] FDA and  has been authorized for detection and/or diagnosis of SARS-CoV-2 by FDA under an Emergency Use Authorization (EUA). This EUA will remain  in effect (meaning this test can be used) for the duration of the COVID-19 declaration under Section 564(b)(1) of the Act, 21 U.S.C.section 360bbb-3(b)(1), unless the authorization is terminated  or revoked sooner.       Influenza A by PCR NEGATIVE NEGATIVE Final   Influenza B by PCR NEGATIVE NEGATIVE Final    Comment: (NOTE) The Xpert Xpress SARS-CoV-2/FLU/RSV plus assay is intended as an aid in the diagnosis of influenza from Nasopharyngeal swab specimens and should not be used as a sole basis for treatment. Nasal washings and aspirates are unacceptable for Xpert Xpress SARS-CoV-2/FLU/RSV testing.  Fact Sheet for Patients: EntrepreneurPulse.com.au  Fact Sheet for Healthcare Providers: IncredibleEmployment.be  This test is not yet approved or cleared by the [place] FDA and has been authorized for detection and/or diagnosis of SARS-CoV-2 by FDA under an Emergency Use Authorization (EUA). This EUA will remain in effect (meaning this test can be used) for the duration of the COVID-19 declaration under Section 564(b)(1) of the Act, 21 U.S.C. section 360bbb-3(b)(1), unless the authorization is terminated or revoked.  Performed at [facility], [address] [place], [place] [address]       Radiology Studies: CT Renal Stone  Study  Result Date: 07/12/2021 CLINICAL DATA:  UTI.  Back pain. EXAM: CT ABDOMEN AND PELVIS WITHOUT CONTRAST TECHNIQUE: Multidetector CT imaging of the abdomen and pelvis was performed following the standard protocol without IV contrast. COMPARISON:  Right upper quadrant ultrasound 12/26/2019. FINDINGS: Lower chest: There is a 2 mm nodular density in the right middle lobe image 4/1. Hepatobiliary: Small gallstones are present. There is no bile duct dilatation. The liver appears within normal limits. Pancreas: Unremarkable. No pancreatic ductal dilatation or surrounding inflammatory changes. Spleen: There is some capsular calcifications. The spleen is otherwise within normal limits. Adrenals/Urinary Tract: The bilateral adrenal glands and bladder appear within normal limits. There is mild bilateral perinephric fat stranding. Kidneys appear mildly enlarged bilaterally. No urinary tract calculi. No perinephric  fluid collection. Stomach/Bowel: There are postsurgical changes in the stomach. Appendix appears normal. No evidence of bowel wall thickening, distention, or inflammatory changes. Vascular/Lymphatic: Aortic atherosclerosis. No enlarged abdominal or pelvic lymph nodes. There are nonenlarged retroperitoneal lymph nodes. Reproductive: Status post hysterectomy. No adnexal masses. Other: No abdominal wall hernia or abnormality. No abdominopelvic ascites. There is some scarring in the anterior lower abdomen. Musculoskeletal: No acute or significant osseous findings. IMPRESSION: 1. Mildly enlarged bilateral kidneys with perinephric fat stranding. Findings are concerning for bilateral pyelonephritis. No hydronephrosis or urinary tract calculi. 2.  Cholelithiasis. 3. 2 mm right solid pulmonary nodule. No routine follow-up imaging is recommended per Fleischner Society Guidelines. These guidelines do not apply to immunocompromised patients and patients with cancer. Follow up in patients with significant comorbidities as  clinically warranted. For lung cancer screening, adhere to Lung-RADS guidelines. Reference: Radiology. 2017; 284(1):228-43. Electronically Signed   By: Ronney Asters M.D.   On: 07/12/2021 15:58    Scheduled Meds:  enoxaparin (LOVENOX) injection  30 mg Subcutaneous Q24H   escitalopram  20 mg Oral Daily   metoprolol tartrate  12.5 mg Oral BID   Continuous Infusions:  sodium chloride 75 mL/hr at 07/12/21 2150   cefTRIAXone (ROCEPHIN)  IV       LOS: 1 day   Time spent: 25 minutes.  Patrecia Pour, MD [facility] [email or web address] 07/13/2021, 11:07 AM

## 2021-07-13 NOTE — TOC Initial Note (Signed)
Transition of Care [facility]) - Initial/Assessment Note    Patient Details  Name: Monica Marks MRN: [id number] Date of Birth: 1966-07-25  [facility] CM/SW Contact:    Leeroy Cha, RN Phone Number: 07/13/2021, 8:44 AM  Clinical Narrative:                 55 y.o. female with medical history significant for HCM, gastric sleeve 15 years ago, who presented to [name] MedSurg unit as a direct admit from [name], accepted by Dr. Sabino Gasser.  She initially presented to the ED with complaints of bilateral flank pain sometimes radiating to her abdomen x 5 days.  Associated with urinary urgency, some retention and urinary discomfort.  Endorses emesis x2 at the beginning of the week.  She has been taking Tylenol for the pain.  Denies fevers or chills.  She went to urgent care 2 days ago and was prescribed Keflex.  Due to no improvement of her symptomatology, she went to the ED for further evaluation.  Upon presentation to the ED, urine analysis was positive for pyuria, CT renal revealed mildly enlarged bilateral kidneys with perinephric fat stranding.  Findings concerning for bilateral pyelonephritis.  No hydronephrosis or urinary tract calculi.  Cholelithiasis, 2 mm right solid pulmonary nodule.  Patient was accepted by hospitalist service and transferred to [facility].   ED Course:  Temperature 98.2.  BP 177/94, pulse 88, respiration rate 15, O2 saturation 98% on room air.  Lab studies remarkable for serum sodium 122, potassium 3.3, BUN 57, creatinine 2.38, alkaline phosphatase 215, albumin 3.3, lactic acid 1.7.  WBC 21.4, hemoglobin 14.4, platelet count 198.   Review of Systems: Review of systems as noted in the HPI. All other systems reviewed and are negative.  TOC PLAN OF CARE: following for hhc needs and progression.  Expected Discharge Plan: Home/Self Care Barriers to Discharge: Continued Medical Work up   Patient Goals and CMS Choice Patient states their goals for this  hospitalization and ongoing recovery are:: to return to my home and be well CMS Medicare.gov Compare Post Acute Care list provided to:: Patient Choice offered to / list presented to : Patient  Expected Discharge Plan and Services Expected Discharge Plan: Home/Self Care   Discharge Planning Services: CM Consult   Living arrangements for the past 2 months: Single Family Home                                      Prior Living Arrangements/Services Living arrangements for the past 2 months: Single Family Home Lives with:: Spouse Patient language and need for interpreter reviewed:: Yes Do you feel safe going back to the place where you live?: Yes            Criminal Activity/Legal Involvement Pertinent to Current Situation/Hospitalization: No - Comment as needed  Activities of Daily Living Home Assistive Devices/Equipment: Contact lenses (1 contact) ADL Screening (condition at time of admission) Patient's cognitive ability adequate to safely complete daily activities?: Yes Is the patient deaf or have difficulty hearing?: No Does the patient have difficulty seeing, even when wearing glasses/contacts?: No Does the patient have difficulty concentrating, remembering, or making decisions?: No Patient able to express need for assistance with ADLs?: Yes Does the patient have difficulty dressing or bathing?: No Independently performs ADLs?: Yes (appropriate for developmental age) Does the patient have difficulty walking or climbing stairs?: No Weakness of  Legs: Both Weakness of Arms/Hands: Both  Permission Sought/Granted                  Emotional Assessment Appearance:: Appears stated age Attitude/Demeanor/Rapport: Engaged Affect (typically observed): Calm Orientation: : Oriented to Place, Oriented to Self, Oriented to Situation, Oriented to  Time Alcohol / Substance Use: Not Applicable Psych Involvement: No (comment)  Admission diagnosis:  Pyelonephritis  [N12] Patient Active Problem List   Diagnosis Date Noted   Pyelonephritis 07/12/2021   Obesity 10/31/2016   GI bleed 06/09/2015   Acute blood loss anemia 06/09/2015   Asthma 06/09/2015   Dyspnea 08/12/2014   Palpitations 08/12/2014   Hypertrophic obstructive cardiomyopathy (HOCM) ([place]) 08/28/2013   Essential hypertension 08/28/2013   Endometriosis 08/23/2012   PCP:  London Pepper, MD Pharmacy:   [id number] - [place], [place] [address] Phone: [phone] Fax: [phone]     Social Determinants of Health (SDOH) Interventions    Readmission Risk Interventions No flowsheet data found.

## 2021-07-14 LAB — BASIC METABOLIC PANEL
Anion gap: 13 (ref 5–15)
BUN: 46 mg/dL — ABNORMAL HIGH (ref 6–20)
CO2: 22 mmol/L (ref 22–32)
Calcium: 8.3 mg/dL — ABNORMAL LOW (ref 8.9–10.3)
Chloride: 89 mmol/L — ABNORMAL LOW (ref 98–111)
Creatinine, Ser: 2.07 mg/dL — ABNORMAL HIGH (ref 0.44–1.00)
GFR, Estimated: 28 mL/min — ABNORMAL LOW (ref >60)
Glucose, Bld: 100 mg/dL — ABNORMAL HIGH (ref 70–99)
Potassium: 3.6 mmol/L (ref 3.5–5.1)
Sodium: 124 mmol/L — ABNORMAL LOW (ref 135–145)

## 2021-07-14 LAB — CBC WITH DIFFERENTIAL/PLATELET
Abs Immature Granulocytes: 0.6 10*3/uL — ABNORMAL HIGH (ref 0.00–0.07)
Basophils Absolute: 0.1 10*3/uL (ref 0.0–0.1)
Basophils Relative: 0 %
Eosinophils Absolute: 0 10*3/uL (ref 0.0–0.5)
Eosinophils Relative: 0 %
HCT: 40.6 % (ref 36.0–46.0)
Hemoglobin: 14.4 g/dL (ref 12.0–15.0)
Immature Granulocytes: 3 %
Lymphocytes Relative: 6 %
Lymphs Abs: 1.1 10*3/uL (ref 0.7–4.0)
MCH: 33.2 pg (ref 26.0–34.0)
MCHC: 35.5 g/dL (ref 30.0–36.0)
MCV: 93.5 fL (ref 80.0–100.0)
Monocytes Absolute: 1.4 10*3/uL — ABNORMAL HIGH (ref 0.1–1.0)
Monocytes Relative: 7 %
Neutro Abs: 15.8 10*3/uL — ABNORMAL HIGH (ref 1.7–7.7)
Neutrophils Relative %: 84 %
Platelets: 260 10*3/uL (ref 150–400)
RBC: 4.34 MIL/uL (ref 3.87–5.11)
RDW: 13.2 % (ref 11.5–15.5)
WBC: 19 10*3/uL — ABNORMAL HIGH (ref 4.0–10.5)
nRBC: 0 % (ref 0.0–0.2)

## 2021-07-14 MED ORDER — CEPHALEXIN 500 MG PO CAPS: 500.0000 mg | ORAL_CAPSULE | Freq: Two times a day (BID) | ORAL | 0 refills | Status: AC

## 2021-07-14 MED ORDER — OXYCODONE HCL 5 MG PO TABS: 5.0000 mg | ORAL_TABLET | Freq: Four times a day (QID) | ORAL | 0 refills | Status: AC | PRN

## 2021-07-14 MED ORDER — ONDANSETRON HCL 4 MG PO TABS: 4.0000 mg | ORAL_TABLET | Freq: Every day | ORAL | 1 refills | Status: AC | PRN

## 2021-07-14 NOTE — Progress Notes (Signed)
D/C instructions reviewed w/ pt and SO. Both verbalize understanding and all questions answered. Pt in possession of d/c packet and all personal belongings. Pt d/c in w/c in stable condition to husband's car by NT.

## 2021-07-14 NOTE — Plan of Care (Signed)
  Problem: Education: Goal: Knowledge of General Education information will improve Description: Including pain rating scale, medication(s)/side effects and non-pharmacologic comfort measures Outcome: Completed/Met   Problem: Nutrition: Goal: Adequate nutrition will be maintained Outcome: Completed/Met   Problem: Elimination: Goal: Will not experience complications related to bowel motility Outcome: Completed/Met Goal: Will not experience complications related to urinary retention Outcome: Completed/Met   Problem: Safety: Goal: Ability to remain free from injury will improve Outcome: Completed/Met

## 2021-07-14 NOTE — Discharge Summary (Signed)
Physician Discharge Summary  ADESUWA OSGOOD VQQ:[id number] DOB: [birth year] DOA: 07/12/2021  PCP: London Pepper, MD  Admit date: 07/12/2021 Discharge date: 07/14/2021  Admitted From: Home Disposition: Home  Recommendations for Outpatient Follow-up:  Follow up with PCP in 1-2 weeks Please obtain BMP/CBC in one week  Discharge Condition: Stable CODE STATUS: Full Diet recommendation: Regular diet as tolerated  Brief/Interim Summary: Monica Marks is a 55 y.o. female with a history of HOCM, OSA, gastric sleeve surgery, HTN, and anxiety who presented to the ED with 5 days of worsening bilateral flank pain, chills, poor appetite and po intake, and urinary hesitancy, dysuria. These symptoms continued despite having taken keflex 500mg  twice daily for 4 doses prescribed by urgent care. In the ED she was afebrile, hypertensive, with SCr up to 2.38 from baseline of 0.7, WBC 21.4k. Urinalysis demonstrated pyuria with WBC clumps, few bacteria, amorphous crystals, and 6-10 RBCs/HPF. Renal CT revealed mild bilateral renal enlargement with perinephric fat stranding concerning for pyelonephritis. IV antibiotics and IVF fluids given, patient admitted for bilateral pyelonephritis complicated by renal failure that failed outpatient management.  Patient admitted for acute bilateral pyelonephritis who markedly improved with IV antibiotics. Transitioning to PO antibiotics per cultures with keflex given marked improvement over the past 48h - follow up cultures with PCP as discussed.  Discharge Diagnoses:  Acute bilateral pyelonephritis - Transition back to PO abx - continue keflex - patient only took 1 day of antibiotics prior to admission.    AKI: Resolving - No hydronephrosis or calculi noted on CT renal stone study. - Follow labs with PCP next week    Hyponatremia: presumably hypovolemic, resolving - Stable, near baseline   Hypokalemia:  -Resolved with supplementation.    Chronic HFpEF, hypertrophic  cardiomyopathy: Appears asymptomatic - Continue metoprolol as below   HTN:  - Continue home metoprolol. BP is up.   Depression/anxiety: Quiescent.  - Continue SSRI   Discharge Instructions  Discharge Instructions     Diet - low sodium heart healthy   Complete by: As directed    Increase activity slowly   Complete by: As directed       Allergies as of 07/14/2021       Reactions   Tape Itching, Rash   Plastic tape/adhesives  Paper tape is ok        Medication List     TAKE these medications    ALPRAZolam 0.25 MG tablet Commonly known as: XANAX Take 1 tablet by mouth 2 (two) times daily as needed for anxiety.   calcium-vitamin D 500-200 MG-UNIT tablet Commonly known as: OSCAL WITH D Take 1 tablet by mouth 2 (two) times daily.   cephALEXin 500 MG capsule Commonly known as: KEFLEX Take 1 capsule (500 mg total) by mouth 2 (two) times daily for 8 days.   escitalopram 20 MG tablet Commonly known as: LEXAPRO Take 20 mg by mouth daily.   fexofenadine 180 MG tablet Commonly known as: ALLEGRA Take 180 mg by mouth daily.   latanoprost 0.005 % ophthalmic solution Commonly known as: XALATAN Place 1 drop into both eyes at bedtime.   metoprolol tartrate 50 MG tablet Commonly known as: LOPRESSOR Take 1.5 tablets (75 mg total) by mouth 2 (two) times daily.   omeprazole 20 MG capsule Commonly known as: PRILOSEC Take 1 capsule (20 mg total) by mouth daily.   ondansetron 4 MG tablet Commonly known as: Zofran Take 1 tablet (4 mg total) by mouth daily as needed for nausea or vomiting.  oxyCODONE 5 MG immediate release tablet Commonly known as: Oxy IR/ROXICODONE Take 1 tablet (5 mg total) by mouth every 6 (six) hours as needed for up to 3 days for moderate pain or breakthrough pain.   WOMENS MULTI VITAMIN & MINERAL PO Take 1 tablet by mouth daily.        Allergies  Allergen Reactions   Tape Itching and Rash    Plastic tape/adhesives   Paper tape is ok     Consultations: None   Procedures/Studies: CT Renal Stone Study  Result Date: 07/12/2021 CLINICAL DATA:  UTI.  Back pain. EXAM: CT ABDOMEN AND PELVIS WITHOUT CONTRAST TECHNIQUE: Multidetector CT imaging of the abdomen and pelvis was performed following the standard protocol without IV contrast. COMPARISON:  Right upper quadrant ultrasound 12/26/2019. FINDINGS: Lower chest: There is a 2 mm nodular density in the right middle lobe image 4/1. Hepatobiliary: Small gallstones are present. There is no bile duct dilatation. The liver appears within normal limits. Pancreas: Unremarkable. No pancreatic ductal dilatation or surrounding inflammatory changes. Spleen: There is some capsular calcifications. The spleen is otherwise within normal limits. Adrenals/Urinary Tract: The bilateral adrenal glands and bladder appear within normal limits. There is mild bilateral perinephric fat stranding. Kidneys appear mildly enlarged bilaterally. No urinary tract calculi. No perinephric fluid collection. Stomach/Bowel: There are postsurgical changes in the stomach. Appendix appears normal. No evidence of bowel wall thickening, distention, or inflammatory changes. Vascular/Lymphatic: Aortic atherosclerosis. No enlarged abdominal or pelvic lymph nodes. There are nonenlarged retroperitoneal lymph nodes. Reproductive: Status post hysterectomy. No adnexal masses. Other: No abdominal wall hernia or abnormality. No abdominopelvic ascites. There is some scarring in the anterior lower abdomen. Musculoskeletal: No acute or significant osseous findings. IMPRESSION: 1. Mildly enlarged bilateral kidneys with perinephric fat stranding. Findings are concerning for bilateral pyelonephritis. No hydronephrosis or urinary tract calculi. 2.  Cholelithiasis. 3. 2 mm right solid pulmonary nodule. No routine follow-up imaging is recommended per Fleischner Society Guidelines. These guidelines do not apply to immunocompromised patients and patients  with cancer. Follow up in patients with significant comorbidities as clinically warranted. For lung cancer screening, adhere to Lung-RADS guidelines. Reference: Radiology. 2017; 284(1):228-43. Electronically Signed   By: Ronney Asters M.D.   On: 07/12/2021 15:58     Subjective: no acute issues/events overnight   Discharge Exam: Vitals:   07/14/21 0833 07/14/21 1428  BP: (!) 149/85 135/71  Pulse: 93 91  Resp: 16 16  Temp: 98.4 F (36.9 C) 99.5 F (37.5 C)  SpO2: 99% 99%   Vitals:   07/14/21 0440 07/14/21 0500 07/14/21 0833 07/14/21 1428  BP: (!) 160/92  (!) 149/85 135/71  Pulse: 84  93 91  Resp: 17  16 16   Temp: 98.6 F (37 C)  98.4 F (36.9 C) 99.5 F (37.5 C)  TempSrc: Oral  Oral Oral  SpO2: 96%  99% 99%  Weight:  77.6 kg    Height:        General: Pt is alert, awake, not in acute distress Cardiovascular: RRR, S1/S2 +, no rubs, no gallops Respiratory: CTA bilaterally, no wheezing, no rhonchi Abdominal: Soft, NT, ND, bowel sounds + Extremities: no edema, no cyanosis    The results of significant diagnostics from this hospitalization (including imaging, microbiology, ancillary and laboratory) are listed below for reference.     Microbiology: Recent Results (from the past 240 hour(s))  Urine Culture     Status: Abnormal (Preliminary result)   Collection Time: 07/12/21 12:03 PM   Specimen: Urine, Clean  Catch  Result Value Ref Range Status   Specimen Description   Final    URINE, CLEAN CATCH Performed at [facility], [address] [place], [place] [address]    Special Requests   Final    NONE Performed at [facility], [place], [place] [address]    Culture (A)  Final    20,000 COLONIES/mL ESCHERICHIA COLI SUSCEPTIBILITIES TO FOLLOW Performed at [facility], [place] [address], [place], [place] [address]    Report Status PENDING  Incomplete  Culture, blood (routine x 2)     Status: None  (Preliminary result)   Collection Time: 07/12/21  2:40 PM   Specimen: Right Antecubital; Blood  Result Value Ref Range Status   Specimen Description   Final    RIGHT ANTECUBITAL Performed at [facility], [address], [place], [place] [address]    Special Requests   Final    Blood Culture adequate volume Performed at [facility], [address] [place], [place] [address]    Culture   Final    NO GROWTH 2 DAYS Performed at [facility], [place] [address] [place], [place] [address]    Report Status PENDING  Incomplete  Culture, blood (routine x 2)     Status: None (Preliminary result)   Collection Time: 07/12/21  3:00 PM   Specimen: Left Antecubital; Blood  Result Value Ref Range Status   Specimen Description   Final    LEFT ANTECUBITAL Performed at [facility], [address] [place], [place] [address]    Special Requests   Final    Blood Culture adequate volume Performed at [facility], [address] [place], [place] [address]    Culture   Final    NO GROWTH 2 DAYS Performed at [facility], [place] [address], [place], [place] [address]    Report Status PENDING  Incomplete  Resp Panel by RT-PCR (Flu A&B, Covid) Nasopharyngeal Swab     Status: None   Collection Time: 07/12/21  4:04 PM   Specimen: Nasopharyngeal Swab; Nasopharyngeal(NP) swabs in vial transport medium  Result Value Ref Range Status   SARS Coronavirus 2 by RT PCR NEGATIVE NEGATIVE Final    Comment: (NOTE) SARS-CoV-2 target nucleic acids are NOT DETECTED.  The SARS-CoV-2 RNA is generally detectable in upper respiratory specimens during the acute phase of infection. The lowest concentration of SARS-CoV-2 viral copies this assay can detect is 138 copies/mL. A negative result does not preclude SARS-Cov-2 infection and should not be used as the sole basis for treatment or other patient management decisions. A  negative result may occur with  improper specimen collection/handling, submission of specimen other than nasopharyngeal swab, presence of viral mutation(s) within the areas targeted by this assay, and inadequate number of viral copies(<138 copies/mL). A negative result must be combined with clinical observations, patient history, and epidemiological information. The expected result is Negative.  Fact Sheet for Patients:  EntrepreneurPulse.com.au  Fact Sheet for Healthcare Providers:  IncredibleEmployment.be  This test is no t yet approved or cleared by the [place] FDA and  has been authorized for detection and/or diagnosis of SARS-CoV-2 by FDA under an Emergency Use Authorization (EUA). This EUA will remain  in effect (meaning this test can be used) for the duration of the COVID-19 declaration under Section 564(b)(1) of the Act, 21 U.S.C.section 360bbb-3(b)(1), unless the authorization is terminated  or revoked sooner.  Influenza A by PCR NEGATIVE NEGATIVE Final   Influenza B by PCR NEGATIVE NEGATIVE Final    Comment: (NOTE) The Xpert Xpress SARS-CoV-2/FLU/RSV plus assay is intended as an aid in the diagnosis of influenza from Nasopharyngeal swab specimens and should not be used as a sole basis for treatment. Nasal washings and aspirates are unacceptable for Xpert Xpress SARS-CoV-2/FLU/RSV testing.  Fact Sheet for Patients: EntrepreneurPulse.com.au  Fact Sheet for Healthcare Providers: IncredibleEmployment.be  This test is not yet approved or cleared by the [place] FDA and has been authorized for detection and/or diagnosis of SARS-CoV-2 by FDA under an Emergency Use Authorization (EUA). This EUA will remain in effect (meaning this test can be used) for the duration of the COVID-19 declaration under Section 564(b)(1) of the Act, 21 U.S.C. section 360bbb-3(b)(1), unless the authorization  is terminated or revoked.  Performed at [facility], [address], [place], [place] [address]   Urine Culture     Status: None   Collection Time: 07/13/21  3:07 AM   Specimen: Urine, Clean Catch  Result Value Ref Range Status   Specimen Description   Final    URINE, CLEAN CATCH Performed at [facility], [place] [address] [place], [place] [address]    Special Requests   Final    NONE Performed at [facility], [place] [address] [place], [place] [address]    Culture   Final    NO GROWTH Performed at [facility], [place] [address], [place],  [address]    Report Status 07/13/2021 FINAL  Final     Labs: BNP (last 3 results) No results for input(s): BNP in the last 8760 hours. Basic Metabolic Panel: Recent Labs  Lab 07/12/21 1203 07/12/21 2028 07/12/21 2042 07/13/21 0430 07/13/21 1706 07/14/21 0436  NA 122*  --  124* 123* 125* 124*  K 3.3*  --  3.4* 3.5 3.6 3.6  CL 83*  --  85* 88* 89* 89*  CO2 25  --  24 23 21* 22  GLUCOSE 104*  --  111* 104* 107* 100*  BUN 57*  --  57* 59* 54* 46*  CREATININE 2.38* 2.42* 2.18* 2.27* 2.27* 2.07*  CALCIUM 9.0  --  8.7* 8.1* 8.4* 8.3*  MG  --   --   --  1.7  --   --   PHOS  --   --   --  5.1*  --   --    Liver Function Tests: Recent Labs  Lab 07/12/21 1203 07/13/21 0430  AST 11* 8*  ALT 16 14  ALKPHOS 215* 173*  BILITOT 0.9 0.9  PROT 6.8 5.9*  ALBUMIN 3.3* 2.5*   Recent Labs  Lab 07/12/21 1203  LIPASE 20   No results for input(s): AMMONIA in the last 168 hours. CBC: Recent Labs  Lab 07/12/21 1203 07/12/21 2028 07/13/21 0430 07/14/21 0436  WBC 21.4* 22.3* 18.2* 19.0*  NEUTROABS  --   --   --  15.8*  HGB 14.4 15.3* 13.0 14.4  HCT 38.7 42.6 36.2 40.6  MCV 87.4 92.0 92.1 93.5  PLT 198 211 208 260   Cardiac Enzymes: No results for input(s): CKTOTAL, CKMB, CKMBINDEX, TROPONINI in the last 168 hours. BNP: Invalid input(s): POCBNP CBG: No results  for input(s): GLUCAP in the last 168 hours. D-Dimer No results for input(s): DDIMER in the last 72 hours. Hgb A1c No results for input(s): HGBA1C in the last 72 hours. Lipid Profile No results  for input(s): CHOL, HDL, LDLCALC, TRIG, CHOLHDL, LDLDIRECT in the last 72 hours. Thyroid function studies No results for input(s): TSH, T4TOTAL, T3FREE, THYROIDAB in the last 72 hours.  Invalid input(s): FREET3 Anemia work up No results for input(s): VITAMINB12, FOLATE, FERRITIN, TIBC, IRON, RETICCTPCT in the last 72 hours. Urinalysis    Component Value Date/Time   COLORURINE YELLOW 07/12/2021 [place] 07/12/2021 1203   LABSPEC 1.009 07/12/2021 1203   PHURINE 6.0 07/12/2021 1203   GLUCOSEU NEGATIVE 07/12/2021 1203   HGBUR SMALL (A) 07/12/2021 [place] 07/12/2021 [place] 07/12/2021 1203   PROTEINUR 100 (A) 07/12/2021 1203   UROBILINOGEN 0.2 06/09/2015 1930   NITRITE NEGATIVE 07/12/2021 1203   LEUKOCYTESUR MODERATE (A) 07/12/2021 1203   Sepsis Labs Invalid input(s): PROCALCITONIN,  WBC,  LACTICIDVEN Microbiology Recent Results (from the past 240 hour(s))  Urine Culture     Status: Abnormal (Preliminary result)   Collection Time: 07/12/21 12:03 PM   Specimen: Urine, Clean Catch  Result Value Ref Range Status   Specimen Description   Final    URINE, CLEAN CATCH Performed at [facility], [address], [place], [place] [address]    Special Requests   Final    NONE Performed at [facility], [address] [place], [place] [address]    Culture (A)  Final    20,000 COLONIES/mL ESCHERICHIA COLI SUSCEPTIBILITIES TO FOLLOW Performed at [facility], [place] [address], [place], [place] [address]    Report Status PENDING  Incomplete  Culture, blood (routine x 2)     Status: None (Preliminary result)   Collection Time: 07/12/21  2:40 PM   Specimen: Right Antecubital; Blood  Result Value  Ref Range Status   Specimen Description   Final    RIGHT ANTECUBITAL Performed at [facility], [address] [place], [place] [address]    Special Requests   Final    Blood Culture adequate volume Performed at [facility], [address] [place], [place] [address]    Culture   Final    NO GROWTH 2 DAYS Performed at [facility], [place] [address] [place], [place] [address]    Report Status PENDING  Incomplete  Culture, blood (routine x 2)     Status: None (Preliminary result)   Collection Time: 07/12/21  3:00 PM   Specimen: Left Antecubital; Blood  Result Value Ref Range Status   Specimen Description   Final    LEFT ANTECUBITAL Performed at [facility], [address], [place], [place] [address]    Special Requests   Final    Blood Culture adequate volume Performed at [facility], [address], [place], [place] [address]    Culture   Final    NO GROWTH 2 DAYS Performed at [facility], [place] [address], [place], [place] [address]    Report Status PENDING  Incomplete  Resp Panel by RT-PCR (Flu A&B, Covid) Nasopharyngeal Swab     Status: None   Collection Time: 07/12/21  4:04 PM   Specimen: Nasopharyngeal Swab; Nasopharyngeal(NP) swabs in vial transport medium  Result Value Ref Range Status   SARS Coronavirus 2 by RT PCR NEGATIVE NEGATIVE Final    Comment: (NOTE) SARS-CoV-2 target nucleic acids are NOT DETECTED.  The SARS-CoV-2 RNA is generally detectable in upper respiratory specimens during the acute phase of infection. The lowest concentration of SARS-CoV-2 viral copies this assay  can detect is 138 copies/mL. A negative result does not preclude SARS-Cov-2 infection and should not be used as the sole basis for treatment or other patient management decisions. A negative result may occur with  improper specimen collection/handling, submission of specimen other than  nasopharyngeal swab, presence of viral mutation(s) within the areas targeted by this assay, and inadequate number of viral copies(<138 copies/mL). A negative result must be combined with clinical observations, patient history, and epidemiological information. The expected result is Negative.  Fact Sheet for Patients:  EntrepreneurPulse.com.au  Fact Sheet for Healthcare Providers:  IncredibleEmployment.be  This test is no t yet approved or cleared by the [place] FDA and  has been authorized for detection and/or diagnosis of SARS-CoV-2 by FDA under an Emergency Use Authorization (EUA). This EUA will remain  in effect (meaning this test can be used) for the duration of the COVID-19 declaration under Section 564(b)(1) of the Act, 21 U.S.C.section 360bbb-3(b)(1), unless the authorization is terminated  or revoked sooner.       Influenza A by PCR NEGATIVE NEGATIVE Final   Influenza B by PCR NEGATIVE NEGATIVE Final    Comment: (NOTE) The Xpert Xpress SARS-CoV-2/FLU/RSV plus assay is intended as an aid in the diagnosis of influenza from Nasopharyngeal swab specimens and should not be used as a sole basis for treatment. Nasal washings and aspirates are unacceptable for Xpert Xpress SARS-CoV-2/FLU/RSV testing.  Fact Sheet for Patients: EntrepreneurPulse.com.au  Fact Sheet for Healthcare Providers: IncredibleEmployment.be  This test is not yet approved or cleared by the [place] FDA and has been authorized for detection and/or diagnosis of SARS-CoV-2 by FDA under an Emergency Use Authorization (EUA). This EUA will remain in effect (meaning this test can be used) for the duration of the COVID-19 declaration under Section 564(b)(1) of the Act, 21 U.S.C. section 360bbb-3(b)(1), unless the authorization is terminated or revoked.  Performed at [facility], [address] [place], [place] [address]   Urine Culture     Status: None   Collection Time: 07/13/21  3:07 AM   Specimen: Urine, Clean Catch  Result Value Ref Range Status   Specimen Description   Final    URINE, CLEAN CATCH Performed at [facility], [place] [address] [place], [place] [address]    Special Requests   Final    NONE Performed at [facility], [place] [address], [place], [place] [address]    Culture   Final    NO GROWTH Performed at [facility], [place] [address], [place], [place] [address]    Report Status 07/13/2021 FINAL  Final     Time coordinating discharge: Over 30 minutes  SIGNED:   Little Ishikawa, DO [facility] 07/14/2021, 7:39 PM Pager   If 7PM-7AM, please contact night-coverage [email or web address]

## 2021-07-16 LAB — URINE CULTURE: Culture: 20000 — AB

## 2021-07-17 LAB — CULTURE, BLOOD (ROUTINE X 2)
Culture: NO GROWTH
Culture: NO GROWTH
Special Requests: ADEQUATE
Special Requests: ADEQUATE

## 2022-04-26 ENCOUNTER — Other Ambulatory Visit: Payer: Self-pay | Admitting: Student

## 2022-04-28 DIAGNOSIS — F419 Anxiety disorder, unspecified: Secondary | ICD-10-CM | POA: Insufficient documentation

## 2022-04-28 DIAGNOSIS — E559 Vitamin D deficiency, unspecified: Secondary | ICD-10-CM | POA: Insufficient documentation

## 2022-04-28 DIAGNOSIS — K760 Fatty (change of) liver, not elsewhere classified: Secondary | ICD-10-CM | POA: Insufficient documentation

## 2022-04-28 DIAGNOSIS — F329 Major depressive disorder, single episode, unspecified: Secondary | ICD-10-CM | POA: Insufficient documentation

## 2022-04-28 DIAGNOSIS — J309 Allergic rhinitis, unspecified: Secondary | ICD-10-CM | POA: Insufficient documentation

## 2022-04-28 DIAGNOSIS — H409 Unspecified glaucoma: Secondary | ICD-10-CM | POA: Insufficient documentation

## 2022-04-28 DIAGNOSIS — G4733 Obstructive sleep apnea (adult) (pediatric): Secondary | ICD-10-CM | POA: Insufficient documentation

## 2022-04-28 DIAGNOSIS — E785 Hyperlipidemia, unspecified: Secondary | ICD-10-CM | POA: Insufficient documentation

## 2022-04-28 DIAGNOSIS — K921 Melena: Secondary | ICD-10-CM | POA: Insufficient documentation

## 2022-05-13 ENCOUNTER — Encounter (HOSPITAL_COMMUNITY): Payer: Self-pay | Admitting: *Deleted

## 2022-08-15 ENCOUNTER — Other Ambulatory Visit: Payer: Self-pay | Admitting: Internal Medicine

## 2022-08-18 ENCOUNTER — Other Ambulatory Visit: Payer: Self-pay | Admitting: Family Medicine

## 2022-08-18 DIAGNOSIS — Z1231 Encounter for screening mammogram for malignant neoplasm of breast: Secondary | ICD-10-CM

## 2022-10-10 ENCOUNTER — Other Ambulatory Visit: Payer: Self-pay | Admitting: Internal Medicine

## 2022-10-11 DIAGNOSIS — Z1231 Encounter for screening mammogram for malignant neoplasm of breast: Secondary | ICD-10-CM

## 2022-11-15 ENCOUNTER — Other Ambulatory Visit: Payer: Self-pay | Admitting: Internal Medicine

## 2022-11-24 ENCOUNTER — Other Ambulatory Visit: Payer: Self-pay | Admitting: Internal Medicine

## 2022-11-28 ENCOUNTER — Ambulatory Visit
Admission: RE | Admit: 2022-11-28 | Discharge: 2022-11-28 | Disposition: A | Discharge status: Home / Self Care | Payer: BC Managed Care – PPO | Source: Ambulatory Visit | Attending: Family Medicine | Admitting: Family Medicine

## 2022-11-28 DIAGNOSIS — Z1231 Encounter for screening mammogram for malignant neoplasm of breast: Secondary | ICD-10-CM

## 2022-11-30 ENCOUNTER — Other Ambulatory Visit: Payer: Self-pay | Admitting: Family Medicine

## 2022-11-30 DIAGNOSIS — R928 Other abnormal and inconclusive findings on diagnostic imaging of breast: Secondary | ICD-10-CM

## 2022-12-03 ENCOUNTER — Other Ambulatory Visit: Payer: Self-pay | Admitting: Internal Medicine

## 2022-12-15 ENCOUNTER — Ambulatory Visit
Admission: RE | Admit: 2022-12-15 | Discharge: 2022-12-15 | Disposition: A | Discharge status: Home / Self Care | Payer: BC Managed Care – PPO | Source: Ambulatory Visit | Attending: Family Medicine | Admitting: Family Medicine

## 2022-12-15 ENCOUNTER — Other Ambulatory Visit: Payer: Self-pay | Admitting: Family Medicine

## 2022-12-15 DIAGNOSIS — R928 Other abnormal and inconclusive findings on diagnostic imaging of breast: Secondary | ICD-10-CM

## 2022-12-15 DIAGNOSIS — N6310 Unspecified lump in the right breast, unspecified quadrant: Secondary | ICD-10-CM

## 2022-12-19 NOTE — Progress Notes (Unsigned)
Cardiology Office Note Date:  11/06/2017  Patient ID:  Monica Marks, Monica Marks 1965/12/03, MRN [id number] PCP:  Farris Has, MD  Electrophysiologist:  Dr. Ladona Ridgel   Chief Complaint: *** bi-annual visit  History of Present Illness: Monica Marks is a 57 y.o. female with history of HCM w/SAM (historical holters have not noted any VT), HTN, obesity s/p gastric sleeve 10/2016.  She saw Dr. Ladona Ridgel 11/08/2018, she doing well, active, no syncope, pending breast reduction surgery., maintained on BB.  (Unfortunately surgery center ultimately canceled her surgery 2/2 her cardiac diagnosis, despite Dr. Lubertha Basque clearance to proceed)  She saw Mardelle Matte 04/19/21, continued to do well, no symptoms., planned to update her echo.  LVEF >75%, gradients lower then prior echo, no description of SAM  *** symptoms, syncope    Past Medical History:  Diagnosis Date   Anxiety    Asthma    bring inhaler dos   Fibroid    Glaucoma    Headache(784.0)    HOCM (hypertrophic obstructive cardiomyopathy) (HCC)    Hypertension    LVH (left ventricular hypertrophy)    TTE showed severe LVH with SAM & a resting LVOT gradient of which increased to with Valsalva    Sleep apnea    cpap   SOB (shortness of breath) on exertion    During moderate exercise    Past Surgical History:  Procedure Laterality Date   ABDOMINAL HYSTERECTOMY  07/30/2012   Procedure: HYSTERECTOMY ABDOMINAL;  Surgeon: Trellis Paganini, MD;  Location: [facility];  Service: Gynecology;  Laterality: N/A;   ABDOMINOPLASTY  2006   BREAST SURGERY  2006   BREAST LIFT   CESAREAN SECTION     3 TIMES   COLONOSCOPY W/ POLYPECTOMY  05/27/2015   With biopsy   LAPAROSCOPIC GASTRIC SLEEVE RESECTION WITH HIATAL HERNIA REPAIR N/A 10/31/2016   Procedure: LAPAROSCOPIC GASTRIC SLEEVE RESECTION WITH HIATAL HERNIA REPAIR, UPPER ENDO;  Surgeon: Rodman Pickle, MD;  Location: [facility];  Service: General;  Laterality: N/A;   LAPAROSCOPY  07/30/2012    Procedure: LAPAROSCOPY DIAGNOSTIC;  Surgeon: Trellis Paganini, MD;  Location: [facility];  Service: Gynecology;  Laterality: N/A;  attempted   SALPINGOOPHORECTOMY  07/30/2012   Procedure: SALPINGO OOPHORECTOMY;  Surgeon: Trellis Paganini, MD;  Location: [facility];  Service: Gynecology;  Laterality: Right;   UPPER GI ENDOSCOPY  10/31/2016   Procedure: UPPER GI ENDOSCOPY;  Surgeon: De Blanch Kinsinger, MD;  Location: [facility];  Service: General;;    Current Outpatient Medications  Medication Sig Dispense Refill   ALPRAZolam (XANAX) 0.25 MG tablet Take 1 tablet by mouth 2 (two) times daily as needed for anxiety.      calcium-vitamin D (CALCIUM 500/D) 500-200 MG-UNIT tablet Take 1 tablet by mouth 3 (three) times daily.     escitalopram (LEXAPRO) 20 MG tablet Take 20 mg by mouth daily.     latanoprost (XALATAN) 0.005 % ophthalmic solution Place 1 drop into both eyes at bedtime.     metoprolol tartrate (LOPRESSOR) 50 MG tablet TAKE 1 AND 1/2 TABLETS BY MOUTH TWICE DAILY 90 tablet 8   Multiple Vitamins-Minerals (WOMENS MULTI VITAMIN & MINERAL PO) Take 1 tablet by mouth daily.     omeprazole (PRILOSEC) 20 MG capsule Take 1 capsule (20 mg total) by mouth daily. 90 capsule 1   VENTOLIN HFA 108 (90 BASE) MCG/ACT inhaler INHALE 1 TO 2 PUFFS EVERY 4 TO 6 HOURS IF NEEDED FOR WHEEZING  4  No current facility-administered medications for this visit.     Allergies:   Tape   Social History:  The patient  reports that she quit smoking about 9 years ago. Her smoking use included cigarettes. She smoked 1.00 pack per day. she has never used smokeless tobacco. She reports that she drinks alcohol. She reports that she does not use drugs.   Family History:  The patient's family history includes Cancer in her mother; Heart disease in her father; Hypertension in her mother; Lung cancer in her father; Melanoma in her father; Stroke in her other.  ROS:  Please see the history of present illness.  All other systems are  reviewed and otherwise negative.   PHYSICAL EXAM:  VS:  BP (!) 154/98   Pulse 62   Ht 5\' 5"  (1.651 m)   Wt 176 lb 9.6 oz (80.1 kg)   LMP 07/30/2012   BMI 29.39 kg/m  BMI: Body mass index is 29.39 kg/m. Well nourished, well developed, in no acute distress  HEENT: normocephalic, atraumatic  Neck: no JVD, carotid bruits or masses Cardiac: *** RRR; soft SM, no rubs, or gallops Lungs: *** CTA b/l, no wheezing, rhonchi or rales  Abd: soft, nontender MS: no deformity or atrophy Ext: *** no edema  Skin: warm and dry, no rash Neuro:  No gross deficits appreciated Psych: euthymic mood, full affect   EKG:  Done today and reviewed by myself  ***  05/04/21: TTE 1. LVOT peak gradient 15 mmHg at rest, 35 mmHg peak gradient after  valsalva. Left ventricular ejection fraction, by estimation, is >75%. The  left ventricle has hyperdynamic function. The left ventricle has no  regional wall motion abnormalities. There is   severe asymmetric left ventricular hypertrophy of the basal-septal  segment. Left ventricular diastolic parameters are consistent with Grade  II diastolic dysfunction (pseudonormalization).   2. Right ventricular systolic function is normal. The right ventricular  size is normal. There is normal pulmonary artery systolic pressure.   3. Left atrial size was severely dilated.   4. Right atrial size was moderately dilated.   5. The mitral valve is normal in structure. Trivial mitral valve  regurgitation. No evidence of mitral stenosis. Moderate mitral annular  calcification.   6. The aortic valve is grossly normal. Aortic valve regurgitation is not  visualized. No aortic stenosis is present.   7. The inferior vena cava is normal in size with greater than 50%  respiratory variability, suggesting right atrial pressure of 3 mmHg.    08/20/13: TTE Study Conclusions - Left ventricle: There appears to be some Chordal SAM with   turbulence in the LVOT with a resting LVOT gradient  of   which increased to with Valsalva The cavity   size was normal. There was mild concentric and severe   asymmetric hypertrophy, consistent with hypertrophic   cardiomyopathy. Systolic function was normal. The   estimated ejection fraction was in the range of 60% to   65%. Wall motion was normal; there were no regional wall   motion abnormalities. There was an increased relative   contribution of atrial contraction to ventricular filling.   Doppler parameters are consistent with abnormal left   ventricular relaxation (grade 1 diastolic dysfunction). - Left atrium: The atrium was mildly dilated. - Atrial septum: A patent foramen ovale cannot be excluded. - Pericardium, extracardiac: There was a left pleural   effusion. Recommendations:  Consider study if clinically indicated with agitated saline contrast administration.  Recent Labs:  No results found for requested labs within last 8760 hours.  No results found for requested labs within last 8760 hours.   CrCl cannot be calculated (Patient's most recent lab result is older than the maximum 21 days allowed.).   Wt Readings from Last 3 Encounters:  11/06/17 176 lb 9.6 oz (80.1 kg)  02/27/17 188 lb 6.4 oz (85.5 kg)  12/28/16 199 lb 6.4 oz (90.4 kg)     Other studies reviewed: Additional studies/records reviewed today include: summarized above  ASSESSMENT AND PLAN:  1. HCM w/SAM     *** Asymptomatic     Grients lower on her most recent echo      *** Discussed activity recommendations, avoiding strenuous activities  2. HTN    ***  3. Obesity     Now s/p bariatric surgery last year     *** Significant weight loss   Disposition: ***.      Current medicines are reviewed at length with the patient today.  The patient did not have any concerns regarding medicines.  Norma FredricksonSigned, Tyreque Finken, PA-C 11/06/2017 3:03 PM     [facility] [address] [place] [address] [phone]  (office)  [phone] (fax)

## 2022-12-20 ENCOUNTER — Ambulatory Visit
Admission: RE | Admit: 2022-12-20 | Discharge: 2022-12-20 | Disposition: A | Discharge status: Home / Self Care | Payer: BC Managed Care – PPO | Source: Ambulatory Visit | Attending: Family Medicine | Admitting: Family Medicine

## 2022-12-20 DIAGNOSIS — R928 Other abnormal and inconclusive findings on diagnostic imaging of breast: Secondary | ICD-10-CM

## 2022-12-20 DIAGNOSIS — N6310 Unspecified lump in the right breast, unspecified quadrant: Secondary | ICD-10-CM

## 2022-12-20 HISTORY — PX: BREAST BIOPSY: SHX20

## 2022-12-22 ENCOUNTER — Ambulatory Visit: Payer: BC Managed Care – PPO | Attending: Physician Assistant | Admitting: Physician Assistant

## 2022-12-22 ENCOUNTER — Encounter: Payer: Self-pay | Admitting: Physician Assistant

## 2022-12-22 VITALS — BP 156/102 | HR 76 | Ht 65.0 in | Wt 168.4 lb

## 2022-12-22 DIAGNOSIS — I422 Other hypertrophic cardiomyopathy: Secondary | ICD-10-CM | POA: Diagnosis not present

## 2022-12-22 DIAGNOSIS — I10 Essential (primary) hypertension: Secondary | ICD-10-CM | POA: Diagnosis not present

## 2022-12-22 MED ORDER — METOPROLOL TARTRATE 50 MG PO TABS: 75.0000 mg | ORAL_TABLET | Freq: Two times a day (BID) | ORAL | 3 refills | Status: DC

## 2022-12-22 NOTE — Patient Instructions (Signed)
Medication Instructions:   Your physician recommends that you continue on your current medications as directed. Please refer to the Current Medication list given to you today.  *If you need a refill on your cardiac medications before your next appointment, please call your pharmacy*   Lab Work:  NONE ORDERED  TODAY    If you have labs (blood work) drawn today and your tests are completely normal, you will receive your results only by: MyChart Message (if you have MyChart) OR A paper copy in the mail If you have any lab test that is abnormal or we need to change your treatment, we will call you to review the results.   Testing/Procedures: NONE ORDERED  TODAY      Follow-Up: At [facility], you and your health needs are our priority.  As part of our continuing mission to provide you with exceptional heart care, we have created designated Provider Care Teams.  These Care Teams include your primary Cardiologist (physician) and Advanced Practice Providers (APPs -  Physician Assistants and Nurse Practitioners) who all work together to provide you with the care you need, when you need it.  We recommend signing up for the patient portal called "MyChart".  Sign up information is provided on this After Visit Summary.  MyChart is used to connect with patients for Virtual Visits (Telemedicine).  Patients are able to view lab/test results, encounter notes, upcoming appointments, etc.  Non-urgent messages can be sent to your provider as well.   To learn more about what you can do with MyChart, go to [email or web address].    Your next appointment:   1 year(s)  Provider:   Gregg Taylor, MD    Other Instructions  

## 2023-05-18 ENCOUNTER — Encounter (HOSPITAL_COMMUNITY): Payer: Self-pay | Admitting: *Deleted

## 2023-12-23 ENCOUNTER — Other Ambulatory Visit: Payer: Self-pay | Admitting: Physician Assistant

## 2024-01-22 ENCOUNTER — Other Ambulatory Visit: Payer: Self-pay | Admitting: Family Medicine

## 2024-01-22 DIAGNOSIS — Z1231 Encounter for screening mammogram for malignant neoplasm of breast: Secondary | ICD-10-CM

## 2024-01-27 ENCOUNTER — Other Ambulatory Visit: Payer: Self-pay | Admitting: Internal Medicine

## 2024-02-08 ENCOUNTER — Ambulatory Visit
Admission: RE | Admit: 2024-02-08 | Discharge: 2024-02-08 | Disposition: A | Discharge status: Home / Self Care | Source: Ambulatory Visit

## 2024-02-08 DIAGNOSIS — Z1231 Encounter for screening mammogram for malignant neoplasm of breast: Secondary | ICD-10-CM

## 2024-02-14 ENCOUNTER — Other Ambulatory Visit: Payer: Self-pay | Admitting: Family Medicine

## 2024-02-14 DIAGNOSIS — R928 Other abnormal and inconclusive findings on diagnostic imaging of breast: Secondary | ICD-10-CM

## 2024-02-23 ENCOUNTER — Ambulatory Visit
Admission: RE | Admit: 2024-02-23 | Discharge: 2024-02-23 | Disposition: A | Discharge status: Home / Self Care | Source: Ambulatory Visit | Attending: Family Medicine | Admitting: Family Medicine

## 2024-02-23 ENCOUNTER — Ambulatory Visit

## 2024-02-23 DIAGNOSIS — R928 Other abnormal and inconclusive findings on diagnostic imaging of breast: Secondary | ICD-10-CM

## 2024-03-18 NOTE — Progress Notes (Unsigned)
 Cardiology Office Note Date:  03/18/2024  Patient ID:  Monica Marks, Monica Marks 1965-09-29, MRN [id number] PCP:  Kip Righter, MD  Electrophysiologist:  Dr. Waddell   Chief Complaint:  *** annual visit  History of Present Illness: Monica Marks is a 58 y.o. female with history of  HCM w/SAM (historical holters have not noted any VT),  HTN,  obesity s/p gastric sleeve 10/2016.  She saw Dr. Waddell 11/08/2018, she doing well, active, no syncope, pending breast reduction surgery., maintained on BB.  (Unfortunately surgery center ultimately canceled her surgery 2/2 her cardiac diagnosis, despite Dr. Adrian clearance to proceed)  She saw Jodie 04/19/21, continued to do well, no symptoms., planned to update her echo.  LVEF >75%, gradients lower then prior echo, no description of SAM  I saw her 12/22/22 She is doing great! Unfortunately she was unable to get refills on her lopressor  and has been skipping doses to try and ration it to get to today, not taking it last night/this AM. 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 Sees her PMD, gets labs there  TODAY  *** symptoms, syncope *** update echo *** needs MD next >> gen cards probably   Past Medical History:  Diagnosis Date   Anxiety    Asthma    bring inhaler dos   Fibroid    Glaucoma    Headache(784.0)    HOCM (hypertrophic obstructive cardiomyopathy) (HCC)    Hypertension    LVH (left ventricular hypertrophy)    TTE showed severe LVH with SAM & a resting LVOT gradient of which increased to with Valsalva    Sleep apnea    cpap   SOB (shortness of breath) on exertion    During moderate exercise    Past Surgical History:  Procedure Laterality Date   ABDOMINAL HYSTERECTOMY  07/30/2012   Procedure: HYSTERECTOMY ABDOMINAL;  Surgeon: Toribio LITTIE Campanile, MD;   Location: [facility];  Service: Gynecology;  Laterality: N/A;   ABDOMINOPLASTY  2006   BREAST BIOPSY Right 12/20/2022   US  RT BREAST BX W LOC DEV 1ST LESION IMG BX SPEC US  GUIDE 12/20/2022 [facility]   BREAST SURGERY  2006   BREAST LIFT   CESAREAN SECTION     3 TIMES   COLONOSCOPY W/ POLYPECTOMY  05/27/2015   With biopsy   LAPAROSCOPIC GASTRIC SLEEVE RESECTION WITH HIATAL HERNIA REPAIR N/A 10/31/2016   Procedure: LAPAROSCOPIC GASTRIC SLEEVE RESECTION WITH HIATAL HERNIA REPAIR, UPPER ENDO;  Surgeon: Herlene Righter Kinsinger, MD;  Location: [facility];  Service: General;  Laterality: N/A;   LAPAROSCOPY  07/30/2012   Procedure: LAPAROSCOPY DIAGNOSTIC;  Surgeon: Toribio LITTIE Campanile, MD;  Location: [facility];  Service: Gynecology;  Laterality: N/A;  attempted   SALPINGOOPHORECTOMY  07/30/2012   Procedure: SALPINGO OOPHORECTOMY;  Surgeon: Toribio LITTIE Campanile, MD;  Location: [facility];  Service: Gynecology;  Laterality: Right;   UPPER GI ENDOSCOPY  10/31/2016   Procedure: UPPER GI ENDOSCOPY;  Surgeon: Herlene Righter Kinsinger, MD;  Location: [facility];  Service: General;;    Current Outpatient Medications  Medication Sig Dispense Refill   ALPRAZolam  (XANAX ) 0.25 MG tablet Take 1 tablet by mouth 2 (two) times daily as needed for anxiety.      calcium-vitamin D (OSCAL WITH D) 500-200 MG-UNIT tablet Take 1 tablet by mouth 2 (two) times daily.  escitalopram  (LEXAPRO ) 20 MG tablet Take 20 mg by mouth daily.     fexofenadine (ALLEGRA) 180 MG tablet Take 180 mg by mouth daily.     latanoprost  (XALATAN ) 0.005 % ophthalmic solution Place 1 drop into both eyes at bedtime.     metoprolol  tartrate (LOPRESSOR ) 50 MG tablet TAKE 1.5 TABLETS BY MOUTH 2 TIMES DAILY. 270 tablet 0   Multiple Vitamins-Minerals (WOMENS MULTI VITAMIN & MINERAL PO) Take 1 tablet by mouth daily.     omeprazole  (PRILOSEC) 20 MG capsule Take 1 capsule (20 mg total) by mouth daily. 90 capsule 1   No current facility-administered medications for this visit.     Allergies:   Tape   Social History:  The patient  reports that she quit smoking about 15 years ago. Her smoking use included cigarettes. She has never used smokeless tobacco. She reports current alcohol use. She reports that she does not use drugs.   Family History:  The patient's family history includes Cancer in her mother; Heart disease in her father; Hypertension in her mother; Lung cancer in her father; Melanoma in her father; Stroke in an other family member.  ROS:  Please see the history of present illness.  All other systems are reviewed and otherwise negative.   PHYSICAL EXAM:  VS:  LMP 07/30/2012  BMI: There is no height or weight on file to calculate BMI. Well nourished, well developed, in no acute distress  HEENT: normocephalic, atraumatic  Neck: no JVD, carotid bruits or masses Cardiac: *** RRR; soft SM, no rubs, or gallops Lungs: *** CTA b/l, no wheezing, rhonchi or rales  Abd: soft, nontender MS: no deformity or atrophy Ext: *** no edema  Skin: warm and dry, no rash Neuro:  No gross deficits appreciated Psych: euthymic mood, full affect   EKG:  Done today and reviewed by myself  ***   05/04/21: TTE 1. LVOT peak gradient 15 mmHg at rest, 35 mmHg peak gradient after  valsalva. Left ventricular ejection fraction, by estimation, is >75%. The  left ventricle has hyperdynamic function. The left ventricle has no  regional wall motion abnormalities. There is   severe asymmetric left ventricular hypertrophy of the basal-septal  segment. Left ventricular diastolic parameters are consistent with Grade  II diastolic dysfunction (pseudonormalization).   2. Right ventricular systolic function is normal. The right ventricular  size is normal. There is normal pulmonary artery systolic pressure.   3. Left atrial size was severely dilated.   4. Right atrial size was moderately dilated.   5. The mitral valve is normal in structure. Trivial mitral valve  regurgitation. No  evidence of mitral stenosis. Moderate mitral annular  calcification.   6. The aortic valve is grossly normal. Aortic valve regurgitation is not  visualized. No aortic stenosis is present.   7. The inferior vena cava is normal in size with greater than 50%  respiratory variability, suggesting right atrial pressure of 3 mmHg.    08/20/13: TTE Study Conclusions - Left ventricle: There appears to be some Chordal SAM with   turbulence in the LVOT with a resting LVOT gradient of   which increased to with Valsalva The cavity   size was normal. There was mild concentric and severe   asymmetric hypertrophy, consistent with hypertrophic   cardiomyopathy. Systolic function was normal. The   estimated ejection fraction was in the range of 60% to   65%. Wall motion was normal; there were no regional wall   motion abnormalities. There  was an increased relative   contribution of atrial contraction to ventricular filling.   Doppler parameters are consistent with abnormal left   ventricular relaxation (grade 1 diastolic dysfunction). - Left atrium: The atrium was mildly dilated. - Atrial septum: A patent foramen ovale cannot be excluded. - Pericardium, extracardiac: There was a left pleural   effusion. Recommendations:  Consider study if clinically indicated with agitated saline contrast administration.  Recent Labs: No results found for requested labs within last 365 days.  No results found for requested labs within last 365 days.   CrCl cannot be calculated (Patient's most recent lab result is older than the maximum 21 days allowed.).   Wt Readings from Last 3 Encounters:  12/22/22 168 lb 6.4 oz (76.4 kg)  07/14/21 171 lb 1.2 oz (77.6 kg)  04/19/21 168 lb 9.6 oz (76.5 kg)     Other studies reviewed: Additional studies/records reviewed today include: summarized above  ASSESSMENT AND PLAN:  1. HCM w/SAM     *** Asymptomatic     Gradients lower on her most recent echo in  2022, SAM not described     *** Discussed activity recommendations, avoiding strenuous activities  2. HTN     ***     Disposition: we will continue to see her *** annually going forward.      Current medicines are reviewed at length with the patient today.  The patient did not have any concerns regarding medicines.  Bonney Charlies Arthur, PA-C 03/18/2024 9:25 AM     [facility] [address] [place] [address] [phone] (office)  [phone] (fax)

## 2024-03-21 ENCOUNTER — Ambulatory Visit: Payer: Self-pay | Attending: Physician Assistant | Admitting: Physician Assistant

## 2024-03-21 ENCOUNTER — Encounter: Payer: Self-pay | Admitting: Physician Assistant

## 2024-03-21 VITALS — BP 136/88 | HR 66 | Ht 65.0 in | Wt 164.8 lb

## 2024-03-21 DIAGNOSIS — I10 Essential (primary) hypertension: Secondary | ICD-10-CM | POA: Diagnosis not present

## 2024-03-21 DIAGNOSIS — I422 Other hypertrophic cardiomyopathy: Secondary | ICD-10-CM

## 2024-03-21 MED ORDER — METOPROLOL TARTRATE 50 MG PO TABS: 75.0000 mg | ORAL_TABLET | Freq: Two times a day (BID) | ORAL | 3 refills | Status: DC

## 2024-03-21 NOTE — Patient Instructions (Signed)
 Medication Instructions:    Your physician recommends that you continue on your current medications as directed. Please refer to the Current Medication list given to you today.   *If you need a refill on your cardiac medications before your next appointment, please call your pharmacy*  Lab Work:  NONE ORDERED  TODAY   If you have labs (blood work) drawn today and your tests are completely normal, you will receive your results only by: MyChart Message (if you have MyChart) OR A paper copy in the mail If you have any lab test that is abnormal or we need to change your treatment, we will call you to review the results.   Testing/Procedures: NONE ORDERED  TODAY    Follow-Up: At [facility], you and your health needs are our priority.  As part of our continuing mission to provide you with exceptional heart care, our providers are all part of one team.  This team includes your primary Cardiologist (physician) and Advanced Practice Providers or APPs (Physician Assistants and Nurse Practitioners) who all work together to provide you with the care you need, when you need it.   Your next appointment:   NEXT AVAILABLE   WITH [facility]    We recommend signing up for the patient portal called MyChart.  Sign up information is provided on this After Visit Summary.  MyChart is used to connect with patients for Virtual Visits (Telemedicine).  Patients are able to view lab/test results, encounter notes, upcoming appointments, etc.  Non-urgent messages can be sent to your provider as well.   To learn more about what you can do with MyChart, go to [email or web address].   Other Instructions

## 2024-06-16 NOTE — Progress Notes (Unsigned)
 Cardiology Office Note:    Date:  06/20/2024   ID:  Monica Marks, DOB [birth year], MRN [id number]  PCP:  Kip Righter, MD  Cardiologist:  None  Electrophysiologist:  Danelle Birmingham, MD   Referring MD: Kip Righter, MD   Chief Complaint  Patient presents with   Cardiomyopathy    History of Present Illness:    Monica Marks is a 58 y.o. female with a hx of HOCM, OSA, hypertension who presents for follow-up.  Previously followed with Dr. Birmingham.  Cardiac MRI in 08/2013 showed severe basal anterior septal hypertrophy consistent with HCM, patchy LGE in the basal anteroseptal wall.  Echocardiogram 04/2021 showed LVOT peak gradient 15 mmHg at rest and 35 mmHg after Valsalva, EF greater than 75%, normal RV function, severe asymmetric LVH of basal septum, severe left atrial enlargement, moderate right atrial enlargement, no significant valvular disease.  Since last clinic visit, she reports she is doing okay.  Does report some intermittent lightheadedness, but denies any syncope.  Denies any chest pain, dyspnea, lower extremity edema.  Does report some palpitations, feels like fluttering, occurs few times per week.  Does not exercise.   Past Medical History:  Diagnosis Date   Anxiety    Asthma    bring inhaler dos   Fibroid    Glaucoma    Headache(784.0)    HOCM (hypertrophic obstructive cardiomyopathy) (HCC)    Hypertension    LVH (left ventricular hypertrophy)    TTE showed severe LVH with SAM & a resting LVOT gradient of which increased to with Valsalva    Sleep apnea    cpap   SOB (shortness of breath) on exertion    During moderate exercise    Past Surgical History:  Procedure Laterality Date   ABDOMINAL HYSTERECTOMY  07/30/2012   Procedure: HYSTERECTOMY ABDOMINAL;  Surgeon: Toribio LITTIE Campanile, MD;  Location: [facility];  Service: Gynecology;  Laterality: N/A;   ABDOMINOPLASTY  2006   BREAST BIOPSY Right 12/20/2022   US  RT BREAST BX W LOC DEV 1ST LESION IMG BX SPEC US   GUIDE 12/20/2022 [facility]   BREAST SURGERY  2006   BREAST LIFT   CESAREAN SECTION     3 TIMES   COLONOSCOPY W/ POLYPECTOMY  05/27/2015   With biopsy   LAPAROSCOPIC GASTRIC SLEEVE RESECTION WITH HIATAL HERNIA REPAIR N/A 10/31/2016   Procedure: LAPAROSCOPIC GASTRIC SLEEVE RESECTION WITH HIATAL HERNIA REPAIR, UPPER ENDO;  Surgeon: Herlene Righter Kinsinger, MD;  Location: [facility];  Service: General;  Laterality: N/A;   LAPAROSCOPY  07/30/2012   Procedure: LAPAROSCOPY DIAGNOSTIC;  Surgeon: Toribio LITTIE Campanile, MD;  Location: [facility];  Service: Gynecology;  Laterality: N/A;  attempted   SALPINGOOPHORECTOMY  07/30/2012   Procedure: SALPINGO OOPHORECTOMY;  Surgeon: Toribio LITTIE Campanile, MD;  Location: [facility];  Service: Gynecology;  Laterality: Right;   UPPER GI ENDOSCOPY  10/31/2016   Procedure: UPPER GI ENDOSCOPY;  Surgeon: Herlene Righter Kinsinger, MD;  Location: [facility];  Service: General;;    Current Medications: Current Meds  Medication Sig   ALPRAZolam  (XANAX ) 0.25 MG tablet Take 1 tablet by mouth 2 (two) times daily as needed for anxiety.    calcium-vitamin D (OSCAL WITH D) 500-200 MG-UNIT tablet Take 1 tablet by mouth 2 (two) times daily.   escitalopram  (LEXAPRO ) 20 MG tablet Take 20 mg by mouth daily.   estradiol (VIVELLE-DOT) 0.025 MG/24HR Place 1 patch onto the skin 2 (two) times a week.   fexofenadine (ALLEGRA) 180 MG  tablet Take 180 mg by mouth daily.   latanoprost  (XALATAN ) 0.005 % ophthalmic solution Place 1 drop into both eyes at bedtime.   Multiple Vitamins-Minerals (WOMENS MULTI VITAMIN & MINERAL PO) Take 1 tablet by mouth daily.   omeprazole  (PRILOSEC) 20 MG capsule Take 1 capsule (20 mg total) by mouth daily.   ondansetron  (ZOFRAN ) 8 MG tablet Take 8 mg by mouth once.   progesterone (PROMETRIUM) 100 MG capsule Take 100-300 mg by mouth at bedtime.   testosterone (ANDROGEL) 50 MG/5GM (1%) GEL Place 5 g onto the skin daily.   [DISCONTINUED] metoprolol  tartrate (LOPRESSOR ) 50 MG tablet  Take 1.5 tablets (75 mg total) by mouth 2 (two) times daily.     Allergies:   Tape   Social History   Socioeconomic History   Marital status: Married    Spouse name: Not on file   Number of children: Not on file   Years of education: Not on file   Highest education level: Not on file  Occupational History   Not on file  Tobacco Use   Smoking status: Former    Current packs/day: 0.00    Types: Cigarettes    Quit date: 05/25/2008    Years since quitting: 16.0   Smokeless tobacco: Never  Vaping Use   Vaping status: Never Used  Substance and Sexual Activity   Alcohol use: Yes    Comment: occasional   Drug use: No   Sexual activity: Yes    Birth control/protection: Surgical  Other Topics Concern   Not on file  Social History Narrative   Not on file   Social Drivers of Health   Financial Resource Strain: Not on file  Food Insecurity: Not on file  Transportation Needs: Not on file  Physical Activity: Not on file  Stress: Not on file  Social Connections: Unknown (01/25/2022)   Received from [employer]   Social Network    Social Network: Not on file     Family History: The patient's family history includes Cancer in her mother; Heart disease in her father; Hypertension in her mother; Lung cancer in her father; Melanoma in her father; Stroke in an other family member. There is no history of Breast cancer or BRCA 1/2.  ROS:   Please see the history of present illness.     All other systems reviewed and are negative.  EKGs/Labs/Other Studies Reviewed:    The following studies were reviewed today:   EKG:  EKG is not ordered today.    Recent Labs: No results found for requested labs within last 365 days.  Recent Lipid Panel No results found for: CHOL, TRIG, HDL, CHOLHDL, VLDL, LDLCALC, LDLDIRECT  Physical Exam:    VS:  BP (!) 138/92   Pulse 70   Ht 5' 4 (1.626 m)   Wt 168 lb 12.8 oz (76.6 kg)   LMP 07/30/2012   SpO2 97%   BMI 28.97 kg/m      Wt Readings from Last 3 Encounters:  06/20/24 168 lb 12.8 oz (76.6 kg)  03/21/24 164 lb 12.8 oz (74.8 kg)  12/22/22 168 lb 6.4 oz (76.4 kg)     GEN:  Well nourished, well developed in no acute distress HEENT: Normal NECK: No JVD; No carotid bruits CARDIAC: RRR, no murmurs, rubs, gallops RESPIRATORY:  Clear to auscultation without rales, wheezing or rhonchi  ABDOMEN: Soft, non-tender, non-distended MUSCULOSKELETAL:  No edema; No deformity  SKIN: Warm and dry NEUROLOGIC:  Alert and oriented x 3 PSYCHIATRIC:  Normal affect   ASSESSMENT:    1. HOCM (hypertrophic obstructive cardiomyopathy) (HCC)   2. Palpitations   3. Essential hypertension    PLAN:    HOCM: Cardiac MRI in 08/2013 showed severe basal anterior septal hypertrophy consistent with HCM, patchy LGE in the basal anteroseptal wall.  Echocardiogram 04/2021 showed LVOT peak gradient 15 mmHg at rest and 35 mmHg after Valsalva, EF greater than 75%, normal RV function, severe asymmetric LVH of basal septum, severe left atrial enlargement, moderate right atrial enlargement, no significant valvular disease. - Update echocardiogram to evaluate for obstruction - Zio patch x 1 week to evaluate for arrhythmia - Continue metoprolol , will increase to 100 mg twice daily  Palpitations: Description concerning for arrhythmia, evaluate with Zio patch x 7 days as above  Hypertension: BP elevated in clinic today and reports elevated BP when she checks at home.  Will increase metoprolol  to 100 mg twice daily as above.  Asked to check BP daily for next 2 weeks and let us  know results  OSA: lost 60 lbs after gastric sleeve surgery and had been off CPAP since  RTC in 3 months  Medication Adjustments/Labs and Tests Ordered: Current medicines are reviewed at length with the patient today.  Concerns regarding medicines are outlined above.  Orders Placed This Encounter  Procedures   LONG TERM MONITOR (3-14 DAYS)   ECHOCARDIOGRAM COMPLETE    Meds ordered this encounter  Medications   metoprolol  tartrate (LOPRESSOR ) 100 MG tablet    Sig: Take 1 tablet (100 mg total) by mouth 2 (two) times daily.    Dispense:  180 tablet    Refill:  3    Patient Instructions  Medication Instructions:   START TAKING : METOPROLOL  100 MG TWICE A DAY    FOR ONE WEEK : TAKE BLOOD PRESSURE ONCE A DAY  30 -40  MINUTES AFTER TAKING MEDICINES  AND SEND TO MYCHART    *If you need a refill on your cardiac medications before your next appointment, please call your pharmacy*   Lab Work: NONE ORDERED  TODAY    If you have labs (blood work) drawn today and your tests are completely normal, you will receive your results only by: MyChart Message (if you have MyChart) OR A paper copy in the mail If you have any lab test that is abnormal or we need to change your treatment, we will call you to review the results.   Testing/Procedures: Your physician has requested that you have an echocardiogram. Echocardiography is a painless test that uses sound waves to create images of your heart. It provides your doctor with information about the size and shape of your heart and how well your heart's chambers and valves are working. This procedure takes approximately one hour. There are no restrictions for this procedure. Please do NOT wear cologne, perfume, aftershave, or lotions (deodorant is allowed). Please arrive 15 minutes prior to your appointment time.  Please note: We ask at that you not bring children with you during ultrasound (echo/ vascular) testing. Due to room size and safety concerns, children are not allowed in the ultrasound rooms during exams. Our front office staff cannot provide observation of children in our lobby area while testing is being conducted. An adult accompanying a patient to their appointment will only be allowed in the ultrasound room at the discretion of the ultrasound technician under special circumstances. We apologize for any  inconvenience.   Your physician has recommended that you wear an event monitor. Event  monitors are medical devices that record the heart's electrical activity. Doctors most often us  these monitors to diagnose arrhythmias. Arrhythmias are problems with the speed or rhythm of the heartbeat. The monitor is a small, portable device. You can wear one while you do your normal daily activities. This is usually used to diagnose what is causing palpitations/syncope (passing out).    Follow-Up: At [facility], you and your health needs are our priority.  As part of our continuing mission to provide you with exceptional heart care, our providers are all part of one team.  This team includes your primary Cardiologist (physician) and Advanced Practice Providers or APPs (Physician Assistants and Nurse Practitioners) who all work together to provide you with the care you need, when you need it.  Your next appointment:   3 month(s)   Provider:   Dr. Kate     We recommend signing up for the patient portal called MyChart.  Sign up information is provided on this After Visit Summary.  MyChart is used to connect with patients for Virtual Visits (Telemedicine).  Patients are able to view lab/test results, encounter notes, upcoming appointments, etc.  Non-urgent messages can be sent to your provider as well.   To learn more about what you can do with MyChart, go to [email or web address].   Other Instructions  ZIO XT- Long Term Monitor Instructions  Your physician has requested you wear a ZIO patch monitor for 7 days.  This is a single patch monitor. [employer] supplies one patch monitor per enrollment. Additional stickers are not available. Please do not apply patch if you will be having a Nuclear Stress Test,  Echocardiogram, Cardiac CT, MRI, or Chest Xray during the period you would be wearing the  monitor. The patch cannot be worn during these tests. You cannot remove and re-apply the  ZIO XT  patch monitor.  Your ZIO patch monitor will be mailed 3 day USPS to your address on file. It may take 3-5 days  to receive your monitor after you have been enrolled.  Once you have received your monitor, please review the enclosed instructions. Your monitor  has already been registered assigning a specific monitor serial # to you.  Billing and Patient Assistance Program Information  We have supplied [employer] with any of your insurance information on file for billing purposes. [employer] offers a sliding scale Patient Assistance Program for patients that do not have  insurance, or whose insurance does not completely cover the cost of the ZIO monitor.  You must apply for the Patient Assistance Program to qualify for this discounted rate.  To apply, please call [employer] at [phone], select option 4, select option 2, ask to apply for  Patient Assistance Program. Meredeth will ask your household income, and how many people  are in your household. They will quote your out-of-pocket cost based on that information.  [employer] will also be able to set up a 8-month, interest-free payment plan if needed.  Applying the monitor   Shave hair from upper left chest.  Hold abrader disc by orange tab. Rub abrader in 40 strokes over the upper left chest as  indicated in your monitor instructions.  Clean area with 4 enclosed alcohol pads. Let dry.  Apply patch as indicated in monitor instructions. Patch will be placed under collarbone on left  side of chest with arrow pointing upward.  Rub patch adhesive wings for 2 minutes. Remove white label marked 1. Remove the white  label marked 2. Rub patch  adhesive wings for 2 additional minutes.  While looking in a mirror, press and release button in center of patch. A small green light will  flash 3-4 times. This will be your only indicator that the monitor has been turned on.  Do not shower for the first 24 hours. You may shower after the first 24 hours.  Press  the button if you feel a symptom. You will hear a small click. Record Date, Time and  Symptom in the Patient Logbook.  When you are ready to remove the patch, follow instructions on the last 2 pages of Patient  Logbook. Stick patch monitor onto the last page of Patient Logbook.  Place Patient Logbook in the blue and white box. Use locking tab on box and tape box closed  securely. The blue and white box has prepaid postage on it. Please place it in the mailbox as  soon as possible. Your physician should have your test results approximately 7 days after the  monitor has been mailed back to [facility].  Call [facility] Customer Care at [phone] if you have questions regarding  your ZIO XT patch monitor. Call them immediately if you see an orange light blinking on your  monitor.  If your monitor falls off in less than 4 days, contact our Monitor department at [phone].  If your monitor becomes loose or falls off after 4 days call [employer] at [phone] for  suggestions on securing your monitor        Signed, Lonni LITTIE Nanas, MD  06/20/2024 9:46 AM    [facility]

## 2024-06-18 ENCOUNTER — Ambulatory Visit: Admitting: Cardiology

## 2024-06-20 ENCOUNTER — Ambulatory Visit: Attending: Cardiology

## 2024-06-20 ENCOUNTER — Ambulatory Visit: Attending: Cardiology | Admitting: Cardiology

## 2024-06-20 ENCOUNTER — Encounter: Payer: Self-pay | Admitting: Cardiology

## 2024-06-20 VITALS — BP 138/92 | HR 70 | Ht 64.0 in | Wt 168.8 lb

## 2024-06-20 DIAGNOSIS — I421 Obstructive hypertrophic cardiomyopathy: Secondary | ICD-10-CM

## 2024-06-20 DIAGNOSIS — R002 Palpitations: Secondary | ICD-10-CM | POA: Diagnosis not present

## 2024-06-20 DIAGNOSIS — I10 Essential (primary) hypertension: Secondary | ICD-10-CM

## 2024-06-20 MED ORDER — METOPROLOL TARTRATE 100 MG PO TABS: 100.0000 mg | ORAL_TABLET | Freq: Two times a day (BID) | ORAL | 3 refills | Status: AC

## 2024-06-20 NOTE — Progress Notes (Unsigned)
 Enrolled patient for a 7 day Zio XT monitor to be mailed to patients home.

## 2024-06-20 NOTE — Patient Instructions (Addendum)
 Medication Instructions:   START TAKING : METOPROLOL  100 MG TWICE A DAY    FOR ONE WEEK : TAKE BLOOD PRESSURE ONCE A DAY  30 -40  MINUTES AFTER TAKING MEDICINES  AND SEND TO MYCHART    *If you need a refill on your cardiac medications before your next appointment, please call your pharmacy*   Lab Work: NONE ORDERED  TODAY    If you have labs (blood work) drawn today and your tests are completely normal, you will receive your results only by: MyChart Message (if you have MyChart) OR A paper copy in the mail If you have any lab test that is abnormal or we need to change your treatment, we will call you to review the results.   Testing/Procedures: Your physician has requested that you have an echocardiogram. Echocardiography is a painless test that uses sound waves to create images of your heart. It provides your doctor with information about the size and shape of your heart and how well your heart's chambers and valves are working. This procedure takes approximately one hour. There are no restrictions for this procedure. Please do NOT wear cologne, perfume, aftershave, or lotions (deodorant is allowed). Please arrive 15 minutes prior to your appointment time.  Please note: We ask at that you not bring children with you during ultrasound (echo/ vascular) testing. Due to room size and safety concerns, children are not allowed in the ultrasound rooms during exams. Our front office staff cannot provide observation of children in our lobby area while testing is being conducted. An adult accompanying a patient to their appointment will only be allowed in the ultrasound room at the discretion of the ultrasound technician under special circumstances. We apologize for any inconvenience.   Your physician has recommended that you wear an event monitor. Event monitors are medical devices that record the heart's electrical activity. Doctors most often us  these monitors to diagnose arrhythmias. Arrhythmias  are problems with the speed or rhythm of the heartbeat. The monitor is a small, portable device. You can wear one while you do your normal daily activities. This is usually used to diagnose what is causing palpitations/syncope (passing out).    Follow-Up: At [facility], you and your health needs are our priority.  As part of our continuing mission to provide you with exceptional heart care, our providers are all part of one team.  This team includes your primary Cardiologist (physician) and Advanced Practice Providers or APPs (Physician Assistants and Nurse Practitioners) who all work together to provide you with the care you need, when you need it.  Your next appointment:   3 month(s)   Provider:   Dr. Kate     We recommend signing up for the patient portal called MyChart.  Sign up information is provided on this After Visit Summary.  MyChart is used to connect with patients for Virtual Visits (Telemedicine).  Patients are able to view lab/test results, encounter notes, upcoming appointments, etc.  Non-urgent messages can be sent to your provider as well.   To learn more about what you can do with MyChart, go to [email or web address].   Other Instructions  ZIO XT- Long Term Monitor Instructions  Your physician has requested you wear a ZIO patch monitor for 7 days.  This is a single patch monitor. [employer] supplies one patch monitor per enrollment. Additional stickers are not available. Please do not apply patch if you will be having a Nuclear Stress Test,  Echocardiogram, Cardiac CT, MRI, or Chest  Xray during the period you would be wearing the  monitor. The patch cannot be worn during these tests. You cannot remove and re-apply the  ZIO XT patch monitor.  Your ZIO patch monitor will be mailed 3 day USPS to your address on file. It may take 3-5 days  to receive your monitor after you have been enrolled.  Once you have received your monitor, please review the enclosed  instructions. Your monitor  has already been registered assigning a specific monitor serial # to you.  Billing and Patient Assistance Program Information  We have supplied [employer] with any of your insurance information on file for billing purposes. [employer] offers a sliding scale Patient Assistance Program for patients that do not have  insurance, or whose insurance does not completely cover the cost of the ZIO monitor.  You must apply for the Patient Assistance Program to qualify for this discounted rate.  To apply, please call [employer] at [phone], select option 4, select option 2, ask to apply for  Patient Assistance Program. Meredeth will ask your household income, and how many people  are in your household. They will quote your out-of-pocket cost based on that information.  [employer] will also be able to set up a 74-month, interest-free payment plan if needed.  Applying the monitor   Shave hair from upper left chest.  Hold abrader disc by orange tab. Rub abrader in 40 strokes over the upper left chest as  indicated in your monitor instructions.  Clean area with 4 enclosed alcohol pads. Let dry.  Apply patch as indicated in monitor instructions. Patch will be placed under collarbone on left  side of chest with arrow pointing upward.  Rub patch adhesive wings for 2 minutes. Remove white label marked 1. Remove the white  label marked 2. Rub patch adhesive wings for 2 additional minutes.  While looking in a mirror, press and release button in center of patch. A small green light will  flash 3-4 times. This will be your only indicator that the monitor has been turned on.  Do not shower for the first 24 hours. You may shower after the first 24 hours.  Press the button if you feel a symptom. You will hear a small click. Record Date, Time and  Symptom in the Patient Logbook.  When you are ready to remove the patch, follow instructions on the last 2 pages of Patient  Logbook. Stick patch  monitor onto the last page of Patient Logbook.  Place Patient Logbook in the blue and white box. Use locking tab on box and tape box closed  securely. The blue and white box has prepaid postage on it. Please place it in the mailbox as  soon as possible. Your physician should have your test results approximately 7 days after the  monitor has been mailed back to [facility].  Call [facility] Customer Care at [phone] if you have questions regarding  your ZIO XT patch monitor. Call them immediately if you see an orange light blinking on your  monitor.  If your monitor falls off in less than 4 days, contact our Monitor department at [phone].  If your monitor becomes loose or falls off after 4 days call [employer] at [phone] for  suggestions on securing your monitor

## 2024-08-02 ENCOUNTER — Ambulatory Visit (HOSPITAL_COMMUNITY)
Admission: RE | Admit: 2024-08-02 | Discharge: 2024-08-02 | Disposition: A | Discharge status: Home / Self Care | Source: Ambulatory Visit | Attending: Cardiology | Admitting: Cardiology

## 2024-08-02 DIAGNOSIS — I421 Obstructive hypertrophic cardiomyopathy: Secondary | ICD-10-CM | POA: Insufficient documentation

## 2024-08-02 LAB — ECHOCARDIOGRAM COMPLETE
Area-P 1/2: 5.62 cm2
S' Lateral: 2.8 cm

## 2024-08-04 ENCOUNTER — Ambulatory Visit: Payer: Self-pay | Admitting: Cardiology

## 2024-08-04 DIAGNOSIS — I421 Obstructive hypertrophic cardiomyopathy: Secondary | ICD-10-CM | POA: Diagnosis not present

## 2024-09-08 NOTE — Progress Notes (Unsigned)
 " Cardiology Office Note:    Date:  09/10/2024   ID:  Monica, Marks Mar 09, 1966, MRN [id number]  PCP:  Kip Righter, MD  Cardiologist:  None  Electrophysiologist:  Danelle Birmingham, MD   Referring MD: Kip Righter, MD   No chief complaint on file.   History of Present Illness:    Monica Marks is a 58 y.o. female with a hx of HOCM, OSA, hypertension who presents for follow-up.  Previously followed with Dr. Birmingham.  Cardiac MRI in 08/2013 showed severe basal anterior septal hypertrophy consistent with HCM, patchy LGE in the basal anteroseptal wall.  Echocardiogram 04/2021 showed LVOT peak gradient 15 mmHg at rest and 35 mmHg after Valsalva, EF greater than 75%, normal RV function, severe asymmetric LVH of basal septum, severe left atrial enlargement, moderate right atrial enlargement, no significant valvular disease.  Zio patch x 7 days 06/2024 showed 3% atrial flutter burden, longest episode lasting 3 hours 9 minutes with average rate 90 bpm and 1 episode of NSVT lasting 5 beats.  Echocardiogram 08/02/2024 showed moderate asymmetric LVH of septum, no LVOT obstruction, EF 60-65%, G3DD, normal RV function, RVSP 47, severe left atrial enlargement, no significant valvular disease.  Since last clinic visit, she reports she is doing well. Denies any chest pain, dyspnea, lower extremity edema, or palpitations.  Reports some lightheadedness with standing but no syncope.  Had GI bleed ~10 years ago but none since.  Past Medical History:  Diagnosis Date   Anxiety    Asthma    bring inhaler dos   Fibroid    Glaucoma    Headache(784.0)    HOCM (hypertrophic obstructive cardiomyopathy) (HCC)    Hypertension    LVH (left ventricular hypertrophy)    TTE showed severe LVH with SAM & a resting LVOT gradient of which increased to with Valsalva    Sleep apnea    cpap   SOB (shortness of breath) on exertion    During moderate exercise    Past Surgical History:  Procedure Laterality  Date   ABDOMINAL HYSTERECTOMY  07/30/2012   Procedure: HYSTERECTOMY ABDOMINAL;  Surgeon: Toribio LITTIE Campanile, MD;  Location: [facility];  Service: Gynecology;  Laterality: N/A;   ABDOMINOPLASTY  2006   BREAST BIOPSY Right 12/20/2022   US  RT BREAST BX W LOC DEV 1ST LESION IMG BX SPEC US  GUIDE 12/20/2022 [facility]   BREAST SURGERY  2006   BREAST LIFT   CESAREAN SECTION     3 TIMES   COLONOSCOPY W/ POLYPECTOMY  05/27/2015   With biopsy   LAPAROSCOPIC GASTRIC SLEEVE RESECTION WITH HIATAL HERNIA REPAIR N/A 10/31/2016   Procedure: LAPAROSCOPIC GASTRIC SLEEVE RESECTION WITH HIATAL HERNIA REPAIR, UPPER ENDO;  Surgeon: Herlene Righter Kinsinger, MD;  Location: [facility];  Service: General;  Laterality: N/A;   LAPAROSCOPY  07/30/2012   Procedure: LAPAROSCOPY DIAGNOSTIC;  Surgeon: Toribio LITTIE Campanile, MD;  Location: [facility];  Service: Gynecology;  Laterality: N/A;  attempted   SALPINGOOPHORECTOMY  07/30/2012   Procedure: SALPINGO OOPHORECTOMY;  Surgeon: Toribio LITTIE Campanile, MD;  Location: [facility];  Service: Gynecology;  Laterality: Right;   UPPER GI ENDOSCOPY  10/31/2016   Procedure: UPPER GI ENDOSCOPY;  Surgeon: Herlene Righter Kinsinger, MD;  Location: [facility];  Service: General;;    Current Medications: Current Meds  Medication Sig   ALPRAZolam  (XANAX ) 0.25 MG tablet Take 1 tablet by mouth 2 (two) times daily as needed for anxiety.    apixaban (ELIQUIS) 5 MG TABS  tablet Take 1 tablet (5 mg total) by mouth 2 (two) times daily.   calcium-vitamin D (OSCAL WITH D) 500-200 MG-UNIT tablet Take 1 tablet by mouth 2 (two) times daily.   escitalopram  (LEXAPRO ) 20 MG tablet Take 20 mg by mouth daily.   estradiol (VIVELLE-DOT) 0.025 MG/24HR Place 1 patch onto the skin 2 (two) times a week.   fexofenadine (ALLEGRA) 180 MG tablet Take 180 mg by mouth daily.   metoprolol  tartrate (LOPRESSOR ) 100 MG tablet Take 1 tablet (100 mg total) by mouth 2 (two) times daily.   Multiple Vitamins-Minerals (WOMENS MULTI VITAMIN & MINERAL PO)  Take 1 tablet by mouth daily.   omeprazole  (PRILOSEC) 20 MG capsule Take 1 capsule (20 mg total) by mouth daily.   ondansetron  (ZOFRAN ) 8 MG tablet Take 8 mg by mouth once.   progesterone (PROMETRIUM) 100 MG capsule Take 100-300 mg by mouth at bedtime.     Allergies:   Tape   Social History   Socioeconomic History   Marital status: Married    Spouse name: Not on file   Number of children: Not on file   Years of education: Not on file   Highest education level: Not on file  Occupational History   Not on file  Tobacco Use   Smoking status: Former    Current packs/day: 0.00    Average packs/day: 1.0 packs/day    Types: Cigarettes    Quit date: 05/25/2008    Years since quitting: 16.3   Smokeless tobacco: Never  Vaping Use   Vaping status: Never Used  Substance and Sexual Activity   Alcohol use: Yes    Comment: occasional   Drug use: No   Sexual activity: Yes    Birth control/protection: Surgical  Other Topics Concern   Not on file  Social History Narrative   Not on file   Social Drivers of Health   Tobacco Use: Medium Risk (09/10/2024)   Patient History    Smoking Tobacco Use: Former    Smokeless Tobacco Use: Never    Passive Exposure: Not on Actuary Strain: Not on file  Food Insecurity: Not on file  Transportation Needs: Not on file  Physical Activity: Not on file  Stress: Not on file  Social Connections: Not on file  Depression (PHQ2-9): Not on file  Alcohol Screen: Not on file  Housing: Not on file  Utilities: Not on file  Health Literacy: Not on file     Family History: The patient's family history includes Cancer in her mother; Heart disease in her father; Hypertension in her mother; Lung cancer in her father; Melanoma in her father; Stroke in an other family member. There is no history of Breast cancer or BRCA 1/2.  ROS:   Please see the history of present illness.     All other systems reviewed and are negative.  EKGs/Labs/Other  Studies Reviewed:    The following studies were reviewed today:   EKG:  EKG is not ordered today.    Recent Labs: No results found for requested labs within last 365 days.  Recent Lipid Panel No results found for: CHOL, TRIG, HDL, CHOLHDL, VLDL, LDLCALC, LDLDIRECT  Physical Exam:    VS:  BP 136/84 (BP Location: Left Arm, Patient Position: Sitting, Cuff Size: Normal)   Pulse 77   Ht 5' 4 (1.626 m)   Wt 167 lb (75.8 kg)   LMP 07/23/2012   SpO2 98%   BMI 28.67 kg/m  Wt Readings from Last 3 Encounters:  09/10/24 167 lb (75.8 kg)  06/20/24 168 lb 12.8 oz (76.6 kg)  03/21/24 164 lb 12.8 oz (74.8 kg)     GEN:  Well nourished, well developed in no acute distress HEENT: Normal NECK: No JVD; No carotid bruits CARDIAC: RRR, no murmurs, rubs, gallops RESPIRATORY:  Clear to auscultation without rales, wheezing or rhonchi  ABDOMEN: Soft, non-tender, non-distended MUSCULOSKELETAL:  No edema; No deformity  SKIN: Warm and dry NEUROLOGIC:  Alert and oriented x 3 PSYCHIATRIC:  Normal affect   ASSESSMENT:    1. HOCM (hypertrophic obstructive cardiomyopathy) (HCC)   2. Atrial flutter, unspecified type (HCC)   3. Essential hypertension     PLAN:    HOCM: Cardiac MRI in 08/2013 showed severe basal anterior septal hypertrophy consistent with HCM, patchy LGE in the basal anteroseptal wall.  Echocardiogram 04/2021 showed LVOT peak gradient 15 mmHg at rest and 35 mmHg after Valsalva, EF greater than 75%, normal RV function, severe asymmetric LVH of basal septum, severe left atrial enlargement, moderate right atrial enlargement, no significant valvular disease. Zio patch x 7 days 06/2024 showed 3% atrial flutter burden, longest episode lasting 3 hours 9 minutes with average rate 90 bpm and 1 episode of NSVT lasting 5 beats.  Echocardiogram 08/02/2024 showed moderate asymmetric LVH of septum, no LVOT obstruction, EF 60-65%, G3 DD, normal RV function, RVSP 47, severe left atrial  enlargement, no significant valvular disease. - Reports first degree relatives have been screened.  Father had HCM - Continue metoprolol  100 mg twice daily  Atrial flutter: Zio patch x 7 days 06/2024 showed 3% atrial flutter burden, longest episode lasting 3 hours 9 minutes with average rate 90 bpm  - Recommend starting Eliquis 5 mg twice daily.  Check CMET, CBC - Continue metoprolol  100 mg twice daily  Hypertension: Continue metoprolol  100 mg twice daily  OSA: lost 60 lbs after gastric sleeve surgery and had been off CPAP since  RTC in 6 months  Medication Adjustments/Labs and Tests Ordered: Current medicines are reviewed at length with the patient today.  Concerns regarding medicines are outlined above.  Orders Placed This Encounter  Procedures   Comprehensive Metabolic Panel (CMET)   CBC   Meds ordered this encounter  Medications   apixaban (ELIQUIS) 5 MG TABS tablet    Sig: Take 1 tablet (5 mg total) by mouth 2 (two) times daily.    Dispense:  60 tablet    Refill:  0    New start    Patient Instructions  Medication Instructions:  START Eliquis 5mg  Take 1 tablet twice a day *If you need a refill on your cardiac medications before your next appointment, please call your pharmacy*  Lab Work: TODAY-CMET & CBC If you have labs (blood work) drawn today and your tests are completely normal, you will receive your results only by: MyChart Message (if you have MyChart) OR A paper copy in the mail If you have any lab test that is abnormal or we need to change your treatment, we will call you to review the results.  Testing/Procedures: NONE ORDERED  Follow-Up: At [facility], you and your health needs are our priority.  As part of our continuing mission to provide you with exceptional heart care, our providers are all part of one team.  This team includes your primary Cardiologist (physician) and Advanced Practice Providers or APPs (Physician Assistants and Nurse  Practitioners) who all work together to provide you with the care  you need, when you need it.  Your next appointment:   6 month(s)  Provider:   Lonni Nanas, MD    We recommend signing up for the patient portal called MyChart.  Sign up information is provided on this After Visit Summary.  MyChart is used to connect with patients for Virtual Visits (Telemedicine).  Patients are able to view lab/test results, encounter notes, upcoming appointments, etc.  Non-urgent messages can be sent to your provider as well.   To learn more about what you can do with MyChart, go to [email or web address].   Other Instructions            Signed, Lonni LITTIE Nanas, MD  09/10/2024 12:14 PM    [facility] "

## 2024-09-10 ENCOUNTER — Other Ambulatory Visit (HOSPITAL_COMMUNITY): Payer: Self-pay

## 2024-09-10 ENCOUNTER — Ambulatory Visit: Attending: Cardiology | Admitting: Cardiology

## 2024-09-10 ENCOUNTER — Encounter: Payer: Self-pay | Admitting: Cardiology

## 2024-09-10 VITALS — BP 136/84 | HR 77 | Ht 64.0 in | Wt 167.0 lb

## 2024-09-10 DIAGNOSIS — I421 Obstructive hypertrophic cardiomyopathy: Secondary | ICD-10-CM

## 2024-09-10 DIAGNOSIS — I10 Essential (primary) hypertension: Secondary | ICD-10-CM | POA: Diagnosis not present

## 2024-09-10 DIAGNOSIS — I4892 Unspecified atrial flutter: Secondary | ICD-10-CM

## 2024-09-10 MED ORDER — APIXABAN 5 MG PO TABS
5.0000 mg | ORAL_TABLET | Freq: Two times a day (BID) | ORAL | 0 refills | Status: DC
  Filled 2024-09-10: qty 60, 30d supply, fill #0

## 2024-09-10 NOTE — Patient Instructions (Addendum)
 Medication Instructions:  START Eliquis 5mg  Take 1 tablet twice a day *If you need a refill on your cardiac medications before your next appointment, please call your pharmacy*  Lab Work: TODAY-CMET & CBC If you have labs (blood work) drawn today and your tests are completely normal, you will receive your results only by: MyChart Message (if you have MyChart) OR A paper copy in the mail If you have any lab test that is abnormal or we need to change your treatment, we will call you to review the results.  Testing/Procedures: NONE ORDERED  Follow-Up: At [facility], you and your health needs are our priority.  As part of our continuing mission to provide you with exceptional heart care, our providers are all part of one team.  This team includes your primary Cardiologist (physician) and Advanced Practice Providers or APPs (Physician Assistants and Nurse Practitioners) who all work together to provide you with the care you need, when you need it.  Your next appointment:   6 month(s)  Provider:   Lonni Nanas, MD    We recommend signing up for the patient portal called MyChart.  Sign up information is provided on this After Visit Summary.  MyChart is used to connect with patients for Virtual Visits (Telemedicine).  Patients are able to view lab/test results, encounter notes, upcoming appointments, etc.  Non-urgent messages can be sent to your provider as well.   To learn more about what you can do with MyChart, go to [email or web address].   Other Instructions

## 2024-09-11 ENCOUNTER — Ambulatory Visit: Payer: Self-pay | Admitting: Cardiology

## 2024-09-11 LAB — CBC
Hematocrit: 44.3 % (ref 34.0–46.6)
Hemoglobin: 15.1 g/dL (ref 11.1–15.9)
MCH: 34.2 pg — ABNORMAL HIGH (ref 26.6–33.0)
MCHC: 34.1 g/dL (ref 31.5–35.7)
MCV: 101 fL — ABNORMAL HIGH (ref 79–97)
Platelets: 230 x10E3/uL (ref 150–450)
RBC: 4.41 x10E6/uL (ref 3.77–5.28)
RDW: 12.5 % (ref 11.7–15.4)
WBC: 6.7 x10E3/uL (ref 3.4–10.8)

## 2024-09-11 LAB — COMPREHENSIVE METABOLIC PANEL WITH GFR
ALT: 17 IU/L (ref 0–32)
AST: 19 IU/L (ref 0–40)
Albumin: 4.5 g/dL (ref 3.8–4.9)
Alkaline Phosphatase: 84 IU/L (ref 49–135)
BUN/Creatinine Ratio: 14 (ref 9–23)
BUN: 12 mg/dL (ref 6–24)
Bilirubin Total: 0.7 mg/dL (ref 0.0–1.2)
CO2: 24 mmol/L (ref 20–29)
Calcium: 9.5 mg/dL (ref 8.7–10.2)
Chloride: 94 mmol/L — ABNORMAL LOW (ref 96–106)
Creatinine, Ser: 0.85 mg/dL (ref 0.57–1.00)
Globulin, Total: 2.4 g/dL (ref 1.5–4.5)
Glucose: 77 mg/dL (ref 70–99)
Potassium: 4.6 mmol/L (ref 3.5–5.2)
Sodium: 137 mmol/L (ref 134–144)
Total Protein: 6.9 g/dL (ref 6.0–8.5)
eGFR: 80 mL/min/1.73

## 2024-09-20 ENCOUNTER — Ambulatory Visit: Admitting: Cardiology

## 2024-10-06 ENCOUNTER — Other Ambulatory Visit: Payer: Self-pay | Admitting: Cardiology

## 2024-10-07 MED ORDER — APIXABAN 5 MG PO TABS: 5.0000 mg | ORAL_TABLET | Freq: Two times a day (BID) | ORAL | 5 refills | Status: AC
